# Patient Record
Sex: Female | Born: 1995 | Race: Black or African American | Hispanic: No | Smoking: Never smoker
Health system: Southern US, Community
[De-identification: ages and names within clinical notes are randomized; demographics above are authoritative.]

## PROBLEM LIST (undated history)

## (undated) DIAGNOSIS — R011 Cardiac murmur, unspecified: Secondary | ICD-10-CM

## (undated) DIAGNOSIS — G43909 Migraine, unspecified, not intractable, without status migrainosus: Secondary | ICD-10-CM

## (undated) DIAGNOSIS — J45909 Unspecified asthma, uncomplicated: Secondary | ICD-10-CM

## (undated) HISTORY — DX: Cardiac murmur, unspecified: R01.1

## (undated) HISTORY — DX: Migraine, unspecified, not intractable, without status migrainosus: G43.909

---
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
## 2014-06-14 IMAGING — CT CT ABD-PELV W/ CM
1 of 2 series · 15 of 32 positions shown, 19 images · IV contrast (OMNIPAQUE)
Comparison: None

CLINICAL DATA: Left side lump, abdominal pain, question speedy
ileum hernia

CT ABDOMEN AND PELVIS WITH CONTRAST
TECHNIQUE: Multidetector CT imaging of the abdomen and pelvis was
performed following the standard protocol during bolus
administration of intravenous contrast. Sagittal and coronal MPR
images reconstructed from axial data set.
Contrast: 80mL OMNIPAQUE IOHEXOL 300 MG/ML  SOLN Dilute oral
contrast.

[Series 2: routine abdomen/pelvis with · axial · 0.60mm/px · z∈[+654,+1019]mm · 15 of 81 slices shown, 19 images]
[im 4/81  soft-tissue]
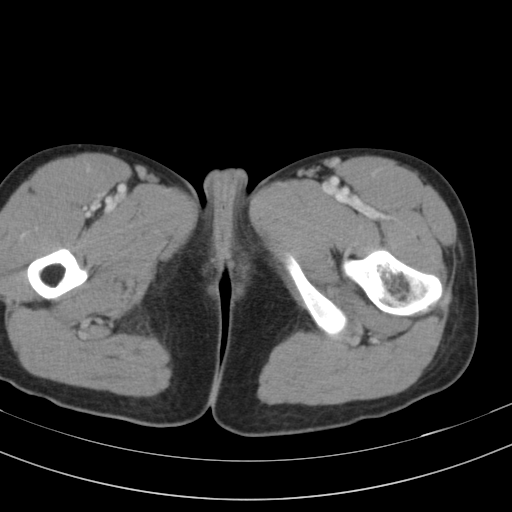
[im 4/81  bone]
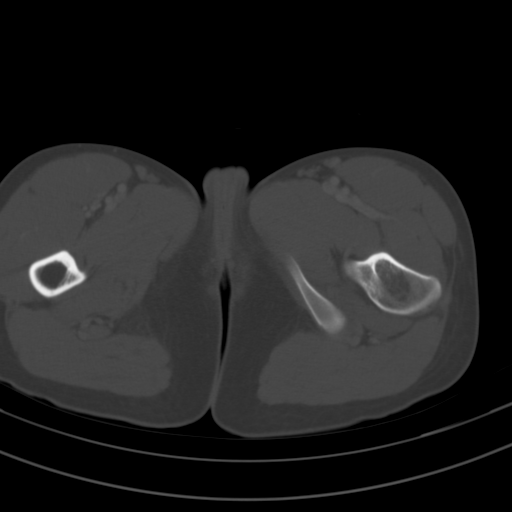
[im 10/81  soft-tissue]
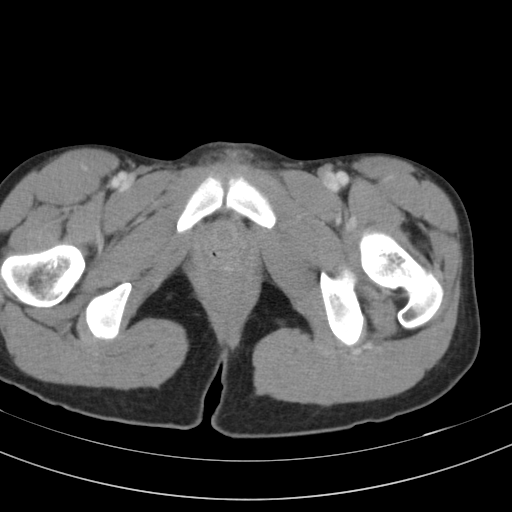
[im 17/81  soft-tissue]
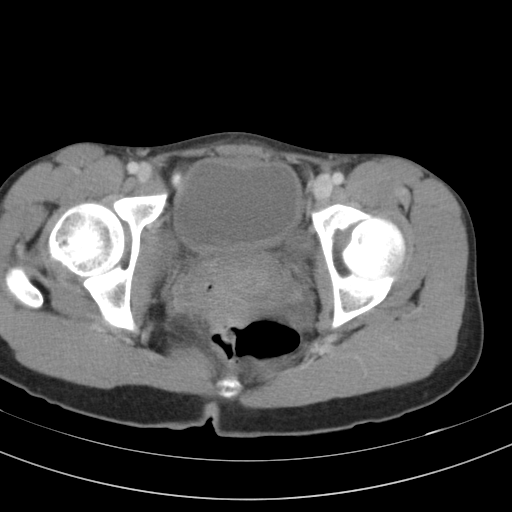
[im 23/81  soft-tissue]
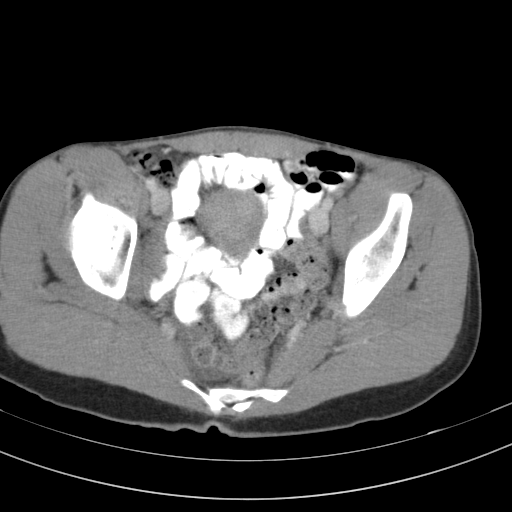
[im 29/81  soft-tissue]
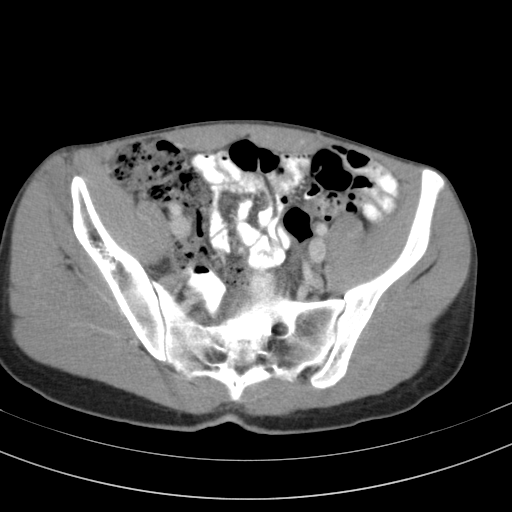
[im 36/81  soft-tissue]
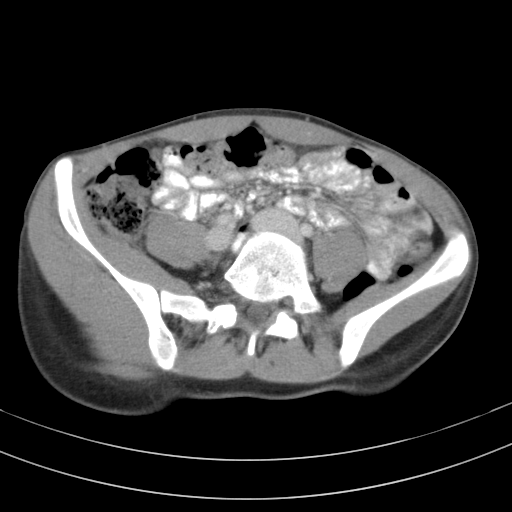
[im 42/81  soft-tissue]
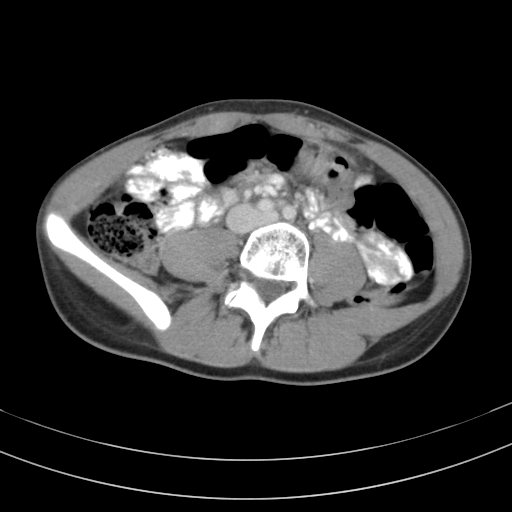
[im 45/81  soft-tissue]
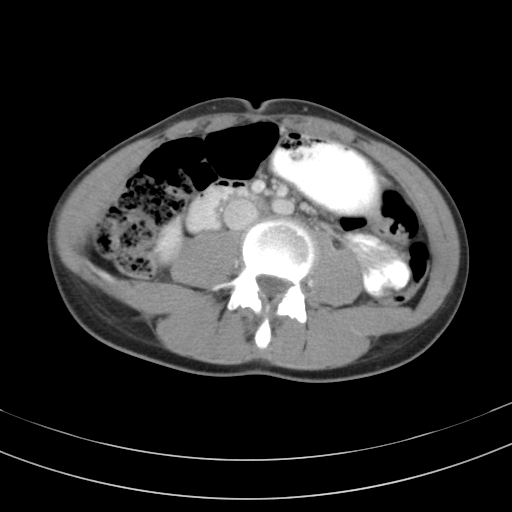
[im 52/81  soft-tissue]
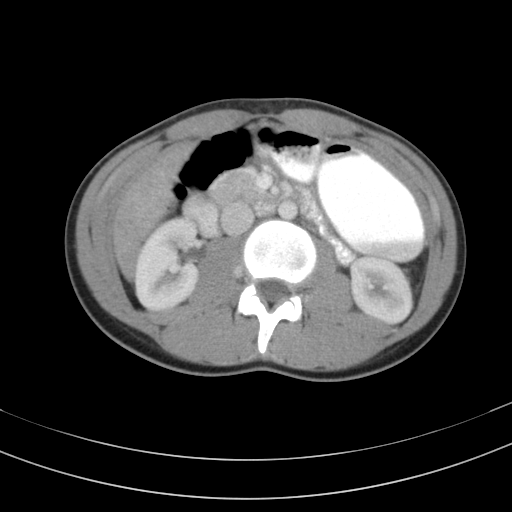
[im 52/81  bone]
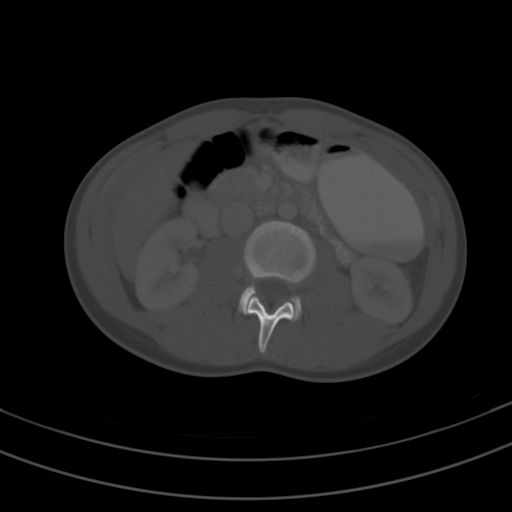
[im 58/81  soft-tissue]
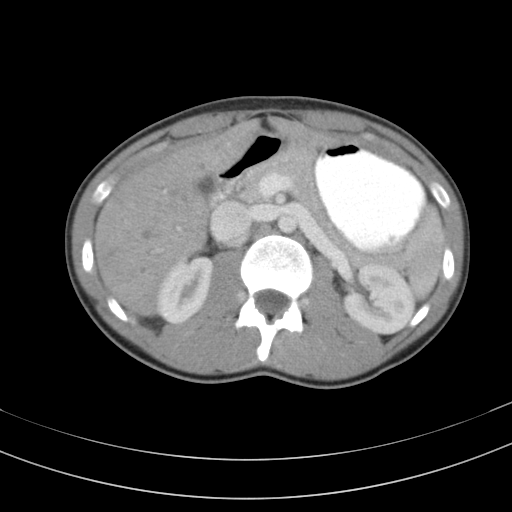
[im 65/81  soft-tissue]
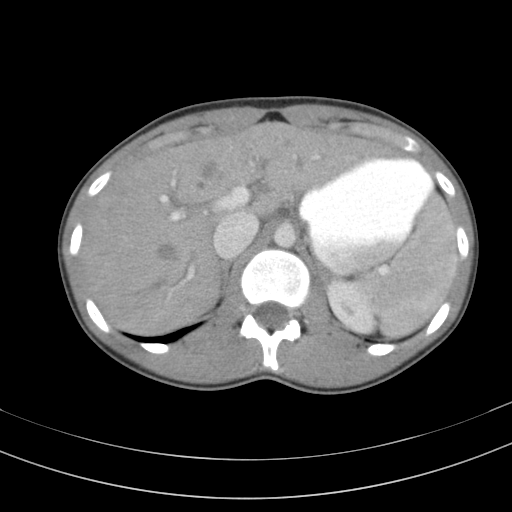
[im 68/81  lung]
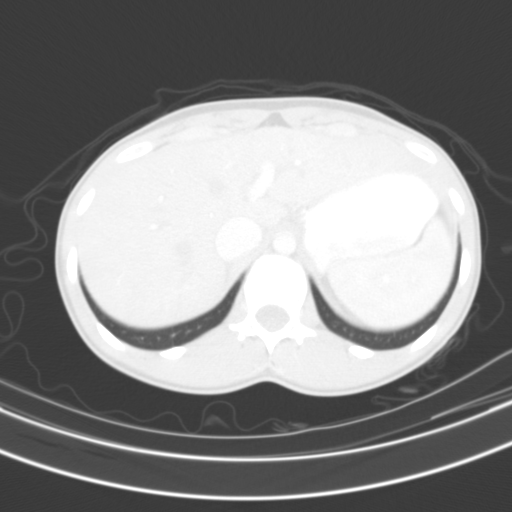
[im 71/81  soft-tissue]
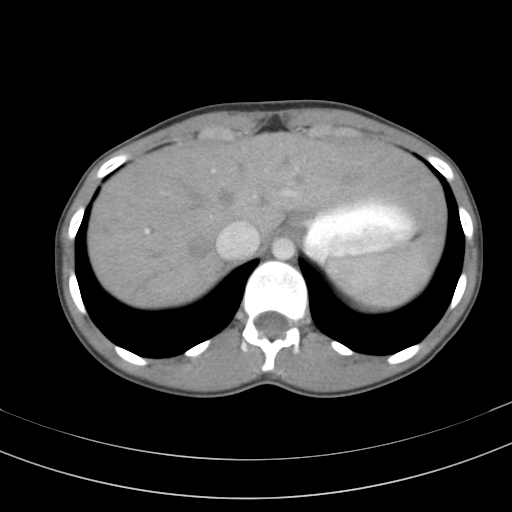
[im 71/81  lung]
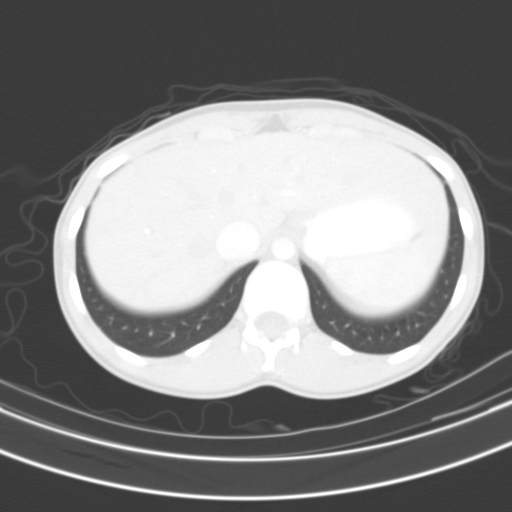
[im 74/81  lung]
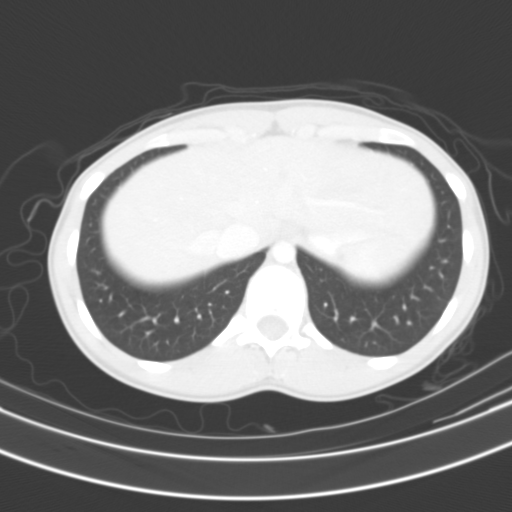
[im 77/81  soft-tissue]
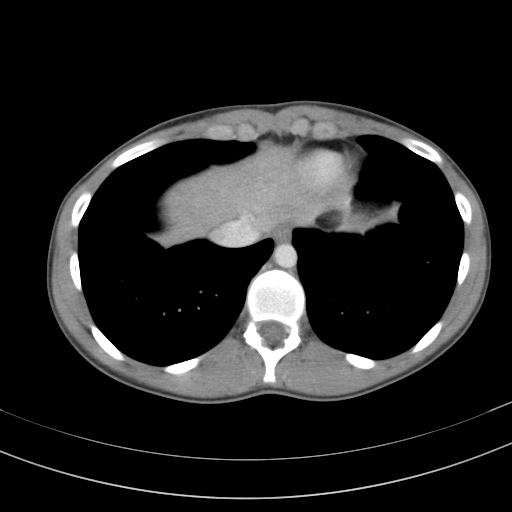
[im 77/81  lung]
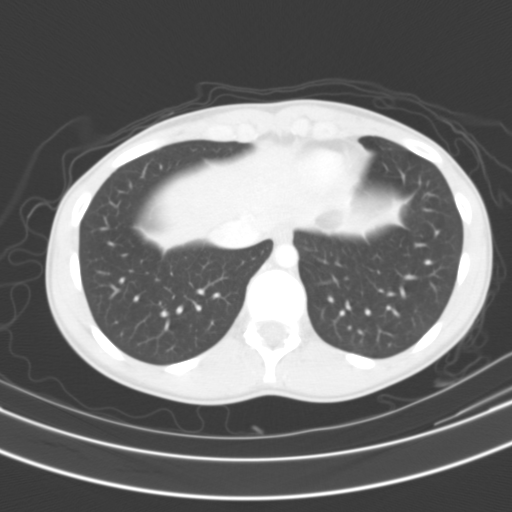

[15 of 32 positions shown; findings below may reference images not displayed]

FINDINGS: Lung bases clear.
Liver, spleen, pancreas, kidneys, and adrenal glands normal.
Tiny umbilical hernia containing fat is questioned.
No evidence of Spigelian hernia identified.
Bladder wall appears minimally prominent though bladder is
incompletely distended. Normal appearing uterus and adnexae for
age, with small right ovarian cysts noted.

Probable normal appendix in lateral right pelvis.
Small amount of nonspecific free pelvic fluid.
Stomach and bowel loops unremarkable.
No mass, adenopathy, additional hernia or free air.
No acute osseous findings.
IMPRESSION: No evidence of Spigelian hernia or left side abdominal mass.
Probable tiny umbilical hernia containing fat.

## 2019-01-09 DIAGNOSIS — M419 Scoliosis, unspecified: Secondary | ICD-10-CM | POA: Insufficient documentation

## 2019-07-27 DIAGNOSIS — E559 Vitamin D deficiency, unspecified: Secondary | ICD-10-CM | POA: Insufficient documentation

## 2021-01-18 ENCOUNTER — Ambulatory Visit (INDEPENDENT_AMBULATORY_CARE_PROVIDER_SITE_OTHER): Payer: 59 | Admitting: Family Medicine

## 2021-01-18 ENCOUNTER — Encounter: Payer: Self-pay | Admitting: Family Medicine

## 2021-01-18 ENCOUNTER — Other Ambulatory Visit: Payer: Self-pay

## 2021-01-18 VITALS — BP 108/68 | HR 79 | Temp 99.1°F | Ht 67.0 in | Wt 158.4 lb

## 2021-01-18 DIAGNOSIS — G43109 Migraine with aura, not intractable, without status migrainosus: Secondary | ICD-10-CM

## 2021-01-18 DIAGNOSIS — F411 Generalized anxiety disorder: Secondary | ICD-10-CM

## 2021-01-18 DIAGNOSIS — F321 Major depressive disorder, single episode, moderate: Secondary | ICD-10-CM | POA: Diagnosis not present

## 2021-01-18 MED ORDER — SUMATRIPTAN SUCCINATE 100 MG PO TABS: 100.0000 mg | ORAL_TABLET | ORAL | 2 refills | Status: DC | PRN

## 2021-01-18 NOTE — Patient Instructions (Addendum)
Please consider counseling. Contact [phone] to schedule an appointment or inquire about cost/insurance coverage.  Aim to do some physical exertion for 150 minutes per week. This is typically divided into 5 days per week, 30 minutes per day. The activity should be enough to get your heart rate up. Anything is better than nothing if you have time constraints.  Let me know if there are cost issues.   Coping skills Choose 5 that work for you: Take a deep breath Count to 20 Read a book Do a puzzle Meditate Bake Sing Knit Garden Pray Go outside Call a friend Listen to music Take a walk Color Send a note Take a bath Watch a movie Be alone in a quiet place Pet an animal Visit a friend Journal Exercise Stretch   Let us know if you need anything.

## 2021-01-18 NOTE — Progress Notes (Signed)
Chief Complaint  Patient presents with   New Patient (Initial Visit)    Discuss referral to talk to a therapist for Depression/anxiety.       New Patient Visit SUBJECTIVE: HPI: Jasmine Dillon is an 25 y.o.female who is being seen for establishing care.  The patient was previously seen at office across town prior to her moving.  Depression/anxiety Has had issues since early adolescence. Sister has similar issues. She is on medication. Pt was on a sleeping med as she did not want to rely on things. She does not exercise routinely. No HI or SI. No self medication. She is not currently following with a therapist and is interested in doing so.   Migraines Hx of migraines. Lack of eating is a trigger. Lots of screen time can trigger them. Has had since high school. Took a migraine cocktail form PCP in past.  She does not remember what was included but it did make her tired.  Gets around 10 days per month.  She takes Excedrin as needed.  She is not pregnant and is taking the Depo-Provera injection every 3 months for contraception.  She does get an aura with visual sensations and sometimes a fullness on the side of her head.  Past Medical History:  Diagnosis Date   Asthma    Heart murmur    Migraine    History reviewed. No pertinent surgical history. Family History  Problem Relation Age of Onset   Arthritis Mother    Drug abuse Father    Asthma Sister    Drug abuse Maternal Grandmother    COPD Maternal Grandmother    Arthritis Maternal Grandmother    Drug abuse Maternal Grandfather    Hypertension Paternal Grandmother    Drug abuse Paternal Grandmother    Alcohol abuse Paternal Grandmother    Hypertension Paternal Grandfather    Drug abuse Paternal Grandfather    Alcohol abuse Paternal Grandfather    No Known Allergies  Current Outpatient Medications:    albuterol (PROVENTIL HFA;VENTOLIN HFA) 108 (90 BASE) MCG/ACT inhaler, Inhale 2 puffs into the lungs every 6 (six) hours as  needed. For shortness of breath/wheezing, Disp: , Rfl:    ibuprofen (ADVIL,MOTRIN) 200 MG tablet, Take 400 mg by mouth every 6 (six) hours as needed for pain., Disp: , Rfl:    medroxyPROGESTERone (DEPO-PROVERA) 150 MG/ML injection, Inject 150 mg into the muscle every 3 (three) months., Disp: , Rfl:    Multiple Vitamin (MULTIVITAMIN WITH MINERALS) TABS tablet, Take 1 tablet by mouth daily., Disp: , Rfl:    SUMAtriptan (IMITREX) 100 MG tablet, Take 1 tablet (100 mg total) by mouth every 2 (two) hours as needed for migraine. May repeat in 2 hours if headache persists or recurs., Disp: 10 tablet, Rfl: 2  OBJECTIVE: BP 108/68   Pulse 79   Temp 99.1 F (37.3 C) (Oral)   Ht 5\' 7"  (1.702 m)   Wt 158 lb 6 oz (71.8 kg)   SpO2 99%   BMI 24.81 kg/m  General:  well developed, well nourished, in no apparent distress Skin:  no significant moles, warts, or growths Throat/Pharynx:  lips and gingiva without lesion; tongue and uvula midline; non-inflamed pharynx; no exudates or postnasal drainage Lungs:  clear to auscultation, breath sounds equal bilaterally, no respiratory distress Cardio:  regular rate and rhythm, no LE edema or bruits Musculoskeletal:  symmetrical muscle groups noted without atrophy or deformity Neuro:  gait normal, DTRs equal and symmetric throughout, no clonus, no cerebellar  signs, 5/5 strength throughout Psych: well oriented with normal range of affect and appropriate judgment/insight  ASSESSMENT/PLAN: Depression, major, single episode, moderate (HCC) - Plan: Ambulatory referral to Psychology  GAD (generalized anxiety disorder) - Plan: Ambulatory referral to Psychology  Migraine with aura and without status migrainosus, not intractable - Plan: SUMAtriptan (IMITREX) 100 MG tablet  1/2.  Refer to psychology.  We will also provide mental health information resources in her paperwork.  Counseled on exercise.  Declined medication. 3.  Chronic, unstable.  Add Imitrex as needed.  Will  be interesting to see how she does with improved anxiety/depression as this does trigger her migraines.  Declined prophylactic. Patient should return in 2 months to recheck her headaches. The patient voiced understanding and agreement to the plan.   Jilda Roche [place], DO 01/18/21  3:11 PM

## 2021-03-22 ENCOUNTER — Ambulatory Visit (INDEPENDENT_AMBULATORY_CARE_PROVIDER_SITE_OTHER): Payer: 59 | Admitting: Psychology

## 2021-03-22 DIAGNOSIS — F4310 Post-traumatic stress disorder, unspecified: Secondary | ICD-10-CM | POA: Diagnosis not present

## 2021-04-07 ENCOUNTER — Ambulatory Visit (INDEPENDENT_AMBULATORY_CARE_PROVIDER_SITE_OTHER): Payer: 59 | Admitting: Psychology

## 2021-04-07 DIAGNOSIS — F4310 Post-traumatic stress disorder, unspecified: Secondary | ICD-10-CM

## 2021-04-19 ENCOUNTER — Ambulatory Visit: Payer: 59 | Admitting: Psychology

## 2021-07-05 ENCOUNTER — Ambulatory Visit: Payer: 59 | Admitting: Family Medicine

## 2021-07-05 ENCOUNTER — Encounter: Payer: Self-pay | Admitting: Family Medicine

## 2021-07-05 VITALS — BP 110/70 | HR 83 | Temp 98.5°F | Ht 67.0 in | Wt 158.1 lb

## 2021-07-05 DIAGNOSIS — G43109 Migraine with aura, not intractable, without status migrainosus: Secondary | ICD-10-CM

## 2021-07-05 DIAGNOSIS — R040 Epistaxis: Secondary | ICD-10-CM | POA: Diagnosis not present

## 2021-07-05 DIAGNOSIS — J302 Other seasonal allergic rhinitis: Secondary | ICD-10-CM

## 2021-07-05 MED ORDER — PROPRANOLOL HCL ER 60 MG PO CP24: 60.0000 mg | ORAL_CAPSULE | Freq: Every day | ORAL | 2 refills | Status: DC

## 2021-07-05 MED ORDER — ALBUTEROL SULFATE HFA 108 (90 BASE) MCG/ACT IN AERS: 2.0000 | INHALATION_SPRAY | Freq: Four times a day (QID) | RESPIRATORY_TRACT | 2 refills | Status: DC | PRN

## 2021-07-05 MED ORDER — CETIRIZINE HCL 10 MG PO TABS: 10.0000 mg | ORAL_TABLET | Freq: Every day | ORAL | 11 refills | Status: DC

## 2021-07-05 MED ORDER — RIZATRIPTAN BENZOATE 10 MG PO TABS: 10.0000 mg | ORAL_TABLET | ORAL | 0 refills | Status: DC | PRN

## 2021-07-05 NOTE — Progress Notes (Signed)
Chief Complaint  Patient presents with   Follow-up    Refills on all allergy medications/inhalers Nose bleeds Migraines, order a MRI   Epistaxis    Subjective: Patient is a 26 y.o. female here for f/u.  Seasonal allergies- Was on INCS and Xyzal and still having runny nose and itchy eyes.  Wondering if there is anything else.  Nose bleeds- 3 weeks ago had some nose bleeds. Started on R side and then hit both sides. No trauma. Does have allergies and blows her nose a lot.  She has not had any issues over the past week.  Migraines - hx of cluster migraines. She will slur her words and have balance issues. Pain is severe. Tries taking ibuprofen and Excedrin with little relief. She has failed Imitrex that did sedate her but did not help very much. Stress, poor PO intake and poor hydration contribute to triggering these. Associated balance issues, nausea and vision changes.   Past Medical History:  Diagnosis Date   Asthma    Heart murmur    Migraine     Objective: BP 110/70    Pulse 83    Temp 98.5 F (36.9 C) (Oral)    Ht 5\' 7"  (1.702 m)    Wt 158 lb 2 oz (71.7 kg)    SpO2 99%    BMI 24.77 kg/m  General: Awake, appears stated age Heart: RRR, no LE edema HEENT: Nares are patent without discharge, over the medial right nare/septum there is evidence of bleeding and varicosities noted. Lungs: CTAB, no rales, wheezes or rhonchi. No accessory muscle use Neuro: DTR's equal and symmetric throughout, no clonus, no cerebellar signs, 5/5 strength throughout.  MSK: No TTP over the suboccipital triangle bilaterally, trapezius, or paraspinal cervical musculature Psych: Age appropriate judgment and insight, normal affect and mood  Assessment and Plan: Seasonal allergies  Migraine with aura and without status migrainosus, not intractable - Plan: rizatriptan (MAXALT) 10 MG tablet, propranolol ER (INDERAL LA) 60 MG 24 hr capsule  Epistaxis  Chronic, uncontrolled.  Change Xyzal to Zyrtec.   Continue intranasal corticosteroid.  Would consider adding Singulair 10 mg daily. Chronic, uncontrolled.  Change Imitrex to Maxalt.  Add daily prophylactic as above.  Follow-up in 1 month. Air humidifier, avoid digital trauma, antibiotic ointment twice daily.  Consider Vaseline in between.  Her main nostril on exam is the right 1 that needs to be addressed.  I do not think she needs cauterization at this time. The patient voiced understanding and agreement to the plan.  [place], DO 07/05/21  1:41 PM

## 2021-07-05 NOTE — Patient Instructions (Addendum)
Keep the neck loose and stay hydrated.   Consider an air humidifier.   Let's hold off on a scan for now.   There should be no issues with cost.   Use neosporin up your R nostril twice daily.   Let us know if you need anything.  Please consider counseling. Contact [phone] to schedule an appointment or inquire about cost/insurance coverage.  [facility] located at [address], [address], [place], [place].  Phone number = [phone].  Dr. Regan Lemming - Adult Psychiatry.    [facility] located at [address] [place], [place], [place]. Phone number = [phone].   [facility] located at [address] [place], [place].  Phone number = [phone].   [facility] located at [address] [place], [place], [place].  Phone number = [phone].

## 2021-08-09 ENCOUNTER — Ambulatory Visit: Payer: 59 | Admitting: Family Medicine

## 2021-08-16 ENCOUNTER — Ambulatory Visit: Payer: 59 | Admitting: Family Medicine

## 2021-09-27 ENCOUNTER — Ambulatory Visit: Payer: 59 | Admitting: Family Medicine

## 2022-01-24 ENCOUNTER — Ambulatory Visit: Payer: Self-pay | Admitting: Family Medicine

## 2022-01-31 ENCOUNTER — Encounter: Payer: Self-pay | Admitting: Family Medicine

## 2022-01-31 ENCOUNTER — Ambulatory Visit (INDEPENDENT_AMBULATORY_CARE_PROVIDER_SITE_OTHER): Payer: Commercial Managed Care - HMO | Admitting: Family Medicine

## 2022-01-31 VITALS — BP 108/68 | HR 73 | Temp 98.6°F | Ht 67.0 in | Wt 163.0 lb

## 2022-01-31 DIAGNOSIS — J302 Other seasonal allergic rhinitis: Secondary | ICD-10-CM | POA: Diagnosis not present

## 2022-01-31 MED ORDER — MONTELUKAST SODIUM 10 MG PO TABS: 10.0000 mg | ORAL_TABLET | Freq: Every day | ORAL | 3 refills | Status: DC

## 2022-01-31 MED ORDER — LEVOCETIRIZINE DIHYDROCHLORIDE 5 MG PO TABS: 5.0000 mg | ORAL_TABLET | Freq: Every evening | ORAL | 2 refills | Status: DC

## 2022-01-31 NOTE — Progress Notes (Signed)
Chief Complaint  Patient presents with   Allergies    Medication for allergies is not working.    Subjective: Patient is a 26 y.o. female here for allergies.  Has been using Zyrtec 10 mg/d and Flonase without significant relief. She will get sneezing, runny/stuffy nose, fatigue, itchy/watery eyes, itchy throat, ear fullness/pain.  She denies any fevers, shortness of breath, or coughing.  She has never been on Xyzal/levocetirizine or montelukast.  Past Medical History:  Diagnosis Date   Asthma    Heart murmur    Migraine     Objective: BP 108/68   Pulse 73   Temp 98.6 F (37 C) (Oral)   Ht 5\' 7"  (1.702 m)   Wt 163 lb (73.9 kg)   SpO2 98%   BMI 25.53 kg/m  General: Awake, appears stated age Heart: RRR HEENT: Ears patent, TM's neg, nares patent without rhinorrhea, tonsillar pillars with hyperemia. No exudate.  Lungs: CTAB, no rales, wheezes or rhonchi. No accessory muscle use Psych: Age appropriate judgment and insight, normal affect and mood  Assessment and Plan: Seasonal allergies - Plan: montelukast (SINGULAIR) 10 MG tablet, levocetirizine (XYZAL) 5 MG tablet  Chronic, uncontrolled.  Continue Flonase but start daily.  Change cetirizine to levocetirizine 5 mg daily.  Add montelukast 10 mg daily as needed for second line therapy.  I would like to see her in 1 month to recheck this and do her physical. The patient voiced understanding and agreement to the plan.  [place], DO 01/31/22  4:29 PM

## 2022-01-31 NOTE — Patient Instructions (Signed)
Continue the Flonase.  We are changing the cetirizine to levocetirizine. Sometimes this can help. We are also adding a new medication as an adjunct to these.   Let us know if you need anything.

## 2022-03-23 ENCOUNTER — Ambulatory Visit
Admission: EM | Admit: 2022-03-23 | Discharge: 2022-03-23 | Disposition: A | Discharge status: Home / Self Care | Payer: Commercial Managed Care - HMO | Attending: Urgent Care | Admitting: Urgent Care

## 2022-03-23 DIAGNOSIS — T781XXA Other adverse food reactions, not elsewhere classified, initial encounter: Secondary | ICD-10-CM

## 2022-03-23 MED ORDER — EPINEPHRINE 0.3 MG/0.3ML IJ SOAJ: 0.3000 mg | INTRAMUSCULAR | 0 refills | Status: DC | PRN

## 2022-03-23 NOTE — ED Triage Notes (Signed)
Patient presents to UC for possible allergic reaction to handling different type of vegetables. After she began feeling a burning sensation to her face.

## 2022-03-23 NOTE — Discharge Instructions (Signed)
You appear to have had a hypersensitivity reaction, not a true allergic reaction. Due to improvement of your symptoms, watching and waiting is appropriate. You may consider taking Benadryl at home 25 to 50 mg. I did prescribe you an EpiPen in case of anaphylactic reactions in the future. Please have your primary care physician refer you to an immunologist or allergist for further evaluation.

## 2022-03-23 NOTE — ED Provider Notes (Signed)
[facility]    CSN: [id number] Arrival date & time: 03/23/22  1402      History   Chief Complaint Chief Complaint  Patient presents with   Allergic Reaction    HPI Jasmine Dillon is a 26 y.o. female.   Pleasant 26 year old female with a past medical history of allergies and asthma presents today due to concerns of a tingling pulling sensation to her face.  Patient states she uses the home chef box, and got a new [place] recipe today.  She states she was using a long green pepper (uncertain what type of chili it was) and was cutting it up.  After cutting the pepper, she states she was feeling tight in her face.  Reports that her nose was burning her lips were burning and her face was burning.  She has been using a cool cloth to wash off her face and feels that this has been helping.  She was concerned as she felt she went into a panic attack and thought she was having an allergic reaction.  Patient denies any swelling in her lips or throat.  She denies dysphagia, cough or wheezing.  She admits that since symptom onset over an hour ago, symptoms are almost resolved.  She does have a history of allergic reactions in the past and does not have an EpiPen at home.  She continues to take levocetirizine and montelukast daily.   Allergic Reaction   Past Medical History:  Diagnosis Date   Asthma    Heart murmur    Migraine     Patient Active Problem List   Diagnosis Date Noted   Migraine with aura and without status migrainosus, not intractable 07/05/2021   Seasonal allergies 07/05/2021   Vitamin D deficiency 07/27/2019   Scoliosis deformity of spine 01/09/2019   Asthma 12/16/1997   Heart murmur Dec 03, 1994    History reviewed. No pertinent surgical history.  OB History   No obstetric history on file.      Home Medications    Prior to Admission medications   Medication Sig Start Date End Date Taking? Authorizing Provider  EPINEPHrine 0.3 mg/0.3 mL IJ SOAJ  injection Inject 0.3 mg into the muscle as needed for anaphylaxis. 03/23/22  Yes Virgilia Quigg L, PA  albuterol (VENTOLIN HFA) 108 (90 Base) MCG/ACT inhaler Inhale 2 puffs into the lungs every 6 (six) hours as needed. For shortness of breath/wheezing 07/05/21   Shelda Pal, DO  ibuprofen (ADVIL,MOTRIN) 200 MG tablet Take 400 mg by mouth every 6 (six) hours as needed for pain.    [provider]  levocetirizine (XYZAL) 5 MG tablet Take 1 tablet (5 mg total) by mouth every evening. 01/31/22   Shelda Pal, DO  medroxyPROGESTERone (DEPO-PROVERA) 150 MG/ML injection Inject 150 mg into the muscle every 3 (three) months. 08/04/20   [provider]  montelukast (SINGULAIR) 10 MG tablet Take 1 tablet (10 mg total) by mouth at bedtime. 01/31/22   Shelda Pal, DO  Multiple Vitamin (MULTIVITAMIN WITH MINERALS) TABS tablet Take 1 tablet by mouth daily.    [provider]  propranolol ER (INDERAL LA) 60 MG 24 hr capsule Take 1 capsule (60 mg total) by mouth daily. 07/05/21   Shelda Pal, DO  rizatriptan (MAXALT) 10 MG tablet Take 1 tablet (10 mg total) by mouth as needed for migraine. May repeat in 2 hours if needed 07/05/21   Shelda Pal, DO    Family History Family History  Problem Relation Age of Onset   Arthritis Mother    Drug abuse Father    Asthma Sister    Drug abuse Maternal Grandmother    COPD Maternal Grandmother    Arthritis Maternal Grandmother    Drug abuse Maternal Grandfather    Hypertension Paternal Grandmother    Drug abuse Paternal Grandmother    Alcohol abuse Paternal Grandmother    Hypertension Paternal Grandfather    Drug abuse Paternal Grandfather    Alcohol abuse Paternal Grandfather     Social History Social History   Tobacco Use   Smoking status: Never  Substance Use Topics   Alcohol use: No   Drug use: No     Allergies   Patient has no known allergies.   Review of Systems Review of  Systems As per HPI  Physical Exam Triage Vital Signs ED Triage Vitals  Enc Vitals Group     BP 03/23/22 1428 132/85     Pulse Rate 03/23/22 1428 86     Resp 03/23/22 1428 16     Temp 03/23/22 1428 99.1 F (37.3 C)     Temp Source 03/23/22 1428 Oral     SpO2 03/23/22 1428 99 %     Weight --      Height --      Head Circumference --      Peak Flow --      Pain Score 03/23/22 1456 0     Pain Loc --      Pain Edu? --      Excl. in GC? --    No data found.  Updated Vital Signs BP 132/85 (BP Location: Left Arm)   Pulse 86   Temp 99.1 F (37.3 C) (Oral)   Resp 16   SpO2 99%   Visual Acuity Right Eye Distance:   Left Eye Distance:   Bilateral Distance:    Right Eye Near:   Left Eye Near:    Bilateral Near:     Physical Exam Vitals and nursing note reviewed. Exam conducted with a chaperone present.  Constitutional:      General: She is not in acute distress.    Appearance: Normal appearance. She is well-developed and normal weight. She is not ill-appearing, toxic-appearing or diaphoretic.  HENT:     Head: Normocephalic and atraumatic.     Right Ear: Tympanic membrane, ear canal and external ear normal. There is no impacted cerumen.     Left Ear: Tympanic membrane, ear canal and external ear normal. There is no impacted cerumen.     Nose: Nose normal. No congestion or rhinorrhea.     Mouth/Throat:     Mouth: Mucous membranes are moist.     Pharynx: Oropharynx is clear. No oropharyngeal exudate or posterior oropharyngeal erythema.  Eyes:     General: No scleral icterus.       Right eye: No discharge.        Left eye: No discharge.     Extraocular Movements: Extraocular movements intact.     Conjunctiva/sclera: Conjunctivae normal.     Pupils: Pupils are equal, round, and reactive to light.  Cardiovascular:     Rate and Rhythm: Normal rate and regular rhythm.     Heart sounds: No murmur heard. Pulmonary:     Effort: Pulmonary effort is normal. No respiratory  distress.     Breath sounds: Normal breath sounds. No stridor. No wheezing, rhonchi or rales.  Chest:     Chest wall: No tenderness.  Abdominal:     Palpations: Abdomen is soft.     Tenderness: There is no abdominal tenderness.  Musculoskeletal:        General: No swelling.     Cervical back: Normal range of motion and neck supple. No rigidity or tenderness.  Lymphadenopathy:     Cervical: No cervical adenopathy.  Skin:    General: Skin is warm and dry.     Capillary Refill: Capillary refill takes less than 2 seconds.     Coloration: Skin is not jaundiced.     Findings: No bruising, erythema or rash.  Neurological:     Mental Status: She is alert.  Psychiatric:        Mood and Affect: Mood normal.      UC Treatments / Results  Labs (all labs ordered are listed, but only abnormal results are displayed) Labs Reviewed - No data to display  EKG   Radiology No results found.  Procedures Procedures (including critical care time)  Medications Ordered in UC Medications - No data to display  Initial Impression / Assessment and Plan / UC Course  I have reviewed the triage vital signs and the nursing notes.  Pertinent labs & imaging results that were available during my care of the patient were reviewed by me and considered in my medical decision making (see chart for details).     Hypersensitivity food reaction -patient's symptoms were nearly resolved at time of evaluation.  I suspect she had a hypersensitivity reaction to the pepper, which resolved with washing her face with a cool cloth.  No additional signs or symptoms on exam of allergic reaction.  EpiPen called in given patient's history of allergies and asthma in the past, discussed appropriate use.  Recommended to follow-up or referral to an allergist for further evaluation.   Final Clinical Impressions(s) / UC Diagnoses   Final diagnoses:  Hypersensitivity reaction due to food     Discharge Instructions       You appear to have had a hypersensitivity reaction, not a true allergic reaction. Due to improvement of your symptoms, watching and waiting is appropriate. You may consider taking Benadryl at home 25 to 50 mg. I did prescribe you an EpiPen in case of anaphylactic reactions in the future. Please have your primary care physician refer you to an immunologist or allergist for further evaluation.    ED Prescriptions     Medication Sig Dispense Auth. Provider   EPINEPHrine 0.3 mg/0.3 mL IJ SOAJ injection Inject 0.3 mg into the muscle as needed for anaphylaxis. 1 each Maretta Bees, PA      PDMP not reviewed this encounter.   Maretta Bees, [place] 03/23/22 [phone]

## 2022-05-25 ENCOUNTER — Other Ambulatory Visit: Payer: Self-pay | Admitting: Family Medicine

## 2022-05-25 DIAGNOSIS — J302 Other seasonal allergic rhinitis: Secondary | ICD-10-CM

## 2022-10-27 ENCOUNTER — Other Ambulatory Visit: Payer: Self-pay | Admitting: Family Medicine

## 2022-10-27 DIAGNOSIS — J302 Other seasonal allergic rhinitis: Secondary | ICD-10-CM

## 2023-03-26 ENCOUNTER — Ambulatory Visit: Payer: 59 | Admitting: Family Medicine

## 2023-06-02 ENCOUNTER — Other Ambulatory Visit: Payer: Self-pay | Admitting: Family Medicine

## 2023-06-02 DIAGNOSIS — J302 Other seasonal allergic rhinitis: Secondary | ICD-10-CM

## 2023-08-19 ENCOUNTER — Ambulatory Visit
Admission: EM | Admit: 2023-08-19 | Discharge: 2023-08-19 | Disposition: A | Discharge status: Home / Self Care | Payer: No Typology Code available for payment source | Attending: Family Medicine | Admitting: Family Medicine

## 2023-08-19 DIAGNOSIS — Z3201 Encounter for pregnancy test, result positive: Secondary | ICD-10-CM

## 2023-08-19 DIAGNOSIS — Z3A01 Less than 8 weeks gestation of pregnancy: Secondary | ICD-10-CM

## 2023-08-19 LAB — POCT URINE PREGNANCY: Preg Test, Ur: POSITIVE — AB

## 2023-08-19 NOTE — ED Triage Notes (Signed)
 Pt requested pregnancy test. Reports nausea. Reports 2 positive pregnancy test yesterday.

## 2023-08-19 NOTE — Discharge Instructions (Signed)
 Please establish with OB for your prenatal care.

## 2023-08-19 NOTE — ED Provider Notes (Signed)
 [facility]    CSN: [id number] Arrival date & time: 08/19/23  1249      History   Chief Complaint Chief Complaint  Patient presents with   Possible Pregnancy    HPI Jasmine Dillon is a 28 y.o. female presents for possible pregnancy.  Patient reports last day of her menstrual cycle was 06/27/2023.  Reports regular periods and is not on any type of birth control.  States she has had 2 positive home pregnancy test.  Endorses some nausea denies any abdominal pain or vaginal bleeding.  No other concerns at this time.   Possible Pregnancy    Past Medical History:  Diagnosis Date   Asthma    Heart murmur    Migraine     Patient Active Problem List   Diagnosis Date Noted   Migraine with aura and without status migrainosus, not intractable 07/05/2021   Seasonal allergies 07/05/2021   Vitamin D deficiency 07/27/2019   Scoliosis deformity of spine 01/09/2019   Asthma 12/16/1997   Heart murmur 1995-05-06    History reviewed. No pertinent surgical history.  OB History   No obstetric history on file.      Home Medications    Prior to Admission medications   Medication Sig Start Date End Date Taking? Authorizing Provider  aspirin-acetaminophen-caffeine (EXCEDRIN MIGRAINE) [id number] MG tablet Take by mouth every 6 (six) hours as needed for headache.   Yes [provider]  albuterol (VENTOLIN HFA) 108 (90 Base) MCG/ACT inhaler Inhale 2 puffs into the lungs every 6 (six) hours as needed. For shortness of breath/wheezing 07/05/21   Sharlene Dory, DO  EPINEPHrine 0.3 mg/0.3 mL IJ SOAJ injection Inject 0.3 mg into the muscle as needed for anaphylaxis. 03/23/22   Crain, Whitney L, PA  ibuprofen (ADVIL,MOTRIN) 200 MG tablet Take 400 mg by mouth every 6 (six) hours as needed for pain.    [provider]  levocetirizine (XYZAL) 5 MG tablet TAKE 1 TABLET BY MOUTH EVERY DAY IN THE EVENING 05/25/22   Wendling, Jilda Roche, DO  medroxyPROGESTERone  (DEPO-PROVERA) 150 MG/ML injection Inject 150 mg into the muscle every 3 (three) months. 08/04/20   [provider]  montelukast (SINGULAIR) 10 MG tablet TAKE 1 TABLET BY MOUTH EVERYDAY AT BEDTIME 06/04/23   Wendling, Jilda Roche, DO  Multiple Vitamin (MULTIVITAMIN WITH MINERALS) TABS tablet Take 1 tablet by mouth daily.    [provider]  propranolol ER (INDERAL LA) 60 MG 24 hr capsule Take 1 capsule (60 mg total) by mouth daily. 07/05/21   Sharlene Dory, DO  rizatriptan (MAXALT) 10 MG tablet Take 1 tablet (10 mg total) by mouth as needed for migraine. May repeat in 2 hours if needed 07/05/21   Wendling, Jilda Roche, DO    Family History Family History  Problem Relation Age of Onset   Arthritis Mother    Drug abuse Father    Asthma Sister    Drug abuse Maternal Grandmother    COPD Maternal Grandmother    Arthritis Maternal Grandmother    Drug abuse Maternal Grandfather    Hypertension Paternal Grandmother    Drug abuse Paternal Grandmother    Alcohol abuse Paternal Grandmother    Hypertension Paternal Grandfather    Drug abuse Paternal Grandfather    Alcohol abuse Paternal Grandfather     Social History Social History   Tobacco Use   Smoking status: Never  Vaping Use   Vaping status: Never Used  Substance Use Topics  Alcohol use: No   Drug use: No     Allergies   Patient has no known allergies.   Review of Systems Review of Systems  Genitourinary:  Positive for menstrual problem.     Physical Exam Triage Vital Signs ED Triage Vitals  Encounter Vitals Group     BP 08/19/23 1305 113/66     Systolic BP Percentile --      Diastolic BP Percentile --      Pulse Rate 08/19/23 1305 84     Resp 08/19/23 1305 16     Temp 08/19/23 1305 99.1 F (37.3 C)     Temp Source 08/19/23 1305 Oral     SpO2 08/19/23 1305 100 %     Weight --      Height --      Head Circumference --      Peak Flow --      Pain Score 08/19/23 1310 0     Pain Loc  --      Pain Education --      Exclude from Growth Chart --    No data found.  Updated Vital Signs BP 113/66 (BP Location: Right Arm)   Pulse 84   Temp 99.1 F (37.3 C) (Oral)   Resp 16   LMP 06/27/2023 (Exact Date)   SpO2 100%   Visual Acuity Right Eye Distance:   Left Eye Distance:   Bilateral Distance:    Right Eye Near:   Left Eye Near:    Bilateral Near:     Physical Exam Vitals and nursing note reviewed.  Constitutional:      Appearance: Normal appearance.  HENT:     Head: Normocephalic and atraumatic.  Eyes:     Pupils: Pupils are equal, round, and reactive to light.  Cardiovascular:     Rate and Rhythm: Normal rate.  Pulmonary:     Effort: Pulmonary effort is normal.  Skin:    General: Skin is warm and dry.  Neurological:     General: No focal deficit present.     Mental Status: She is alert and oriented to person, place, and time.  Psychiatric:        Mood and Affect: Mood normal.        Behavior: Behavior normal.      UC Treatments / Results  Labs (all labs ordered are listed, but only abnormal results are displayed) Labs Reviewed  POCT URINE PREGNANCY - Abnormal; Notable for the following components:      Result Value   Preg Test, Ur Positive (*)    All other components within normal limits    EKG   Radiology No results found.  Procedures Procedures (including critical care time)  Medications Ordered in UC Medications - No data to display  Initial Impression / Assessment and Plan / UC Course  I have reviewed the triage vital signs and the nursing notes.  Pertinent labs & imaging results that were available during my care of the patient were reviewed by me and considered in my medical decision making (see chart for details).     Reviewed exam and symptoms with patient.  Positive urine pregnancy in clinic.  Advised patient to follow-up with OB for prenatal care.  ER precautions reviewed and patient verbalized understanding. Final  Clinical Impressions(s) / UC Diagnoses   Final diagnoses:  Less than [redacted] weeks gestation of pregnancy     Discharge Instructions      Please establish with OB for your  prenatal care    ED Prescriptions   None    PDMP not reviewed this encounter.   Radford Pax, NP 08/19/23 1344

## 2023-09-28 ENCOUNTER — Other Ambulatory Visit (HOSPITAL_COMMUNITY)
Admission: RE | Admit: 2023-09-28 | Discharge: 2023-09-28 | Disposition: A | Discharge status: Home / Self Care | Source: Ambulatory Visit | Attending: Obstetrics and Gynecology | Admitting: Obstetrics and Gynecology

## 2023-09-28 ENCOUNTER — Encounter: Payer: Self-pay | Admitting: Obstetrics and Gynecology

## 2023-09-28 ENCOUNTER — Ambulatory Visit: Payer: 59 | Admitting: Obstetrics and Gynecology

## 2023-09-28 VITALS — BP 118/85 | HR 77 | Ht 67.0 in | Wt 130.0 lb

## 2023-09-28 DIAGNOSIS — Z124 Encounter for screening for malignant neoplasm of cervix: Secondary | ICD-10-CM | POA: Insufficient documentation

## 2023-09-28 DIAGNOSIS — Z01419 Encounter for gynecological examination (general) (routine) without abnormal findings: Secondary | ICD-10-CM | POA: Diagnosis present

## 2023-09-28 DIAGNOSIS — Z3201 Encounter for pregnancy test, result positive: Secondary | ICD-10-CM

## 2023-09-28 DIAGNOSIS — N898 Other specified noninflammatory disorders of vagina: Secondary | ICD-10-CM | POA: Diagnosis present

## 2023-09-28 DIAGNOSIS — Z1151 Encounter for screening for human papillomavirus (HPV): Secondary | ICD-10-CM | POA: Diagnosis not present

## 2023-09-28 DIAGNOSIS — F419 Anxiety disorder, unspecified: Secondary | ICD-10-CM | POA: Diagnosis not present

## 2023-09-28 LAB — POCT URINE PREGNANCY: Preg Test, Ur: POSITIVE — AB

## 2023-09-28 NOTE — Progress Notes (Signed)
 ANNUAL EXAM Patient name: Jasmine Dillon MRN [id number]  Date of birth: 1996-03-07 Chief Complaint:   Gynecologic Exam  History of Present Illness:   Jasmine Dillon is a 28 y.o. No obstetric history on file. being seen today for a routine annual exam.  Current complaints: annual, resuem depo  Discussed the use of AI scribe software for clinical note transcription with the patient, who gave verbal consent to proceed.  History of Present Illness Jasmine Dillon is a 28 year old female who presents with spotting post-abortion and for an annual exam.  She underwent a surgical abortion on September 04, 2023, and has since experienced spotting and irregular bleeding. Initially, there was heavy bleeding a few days post-procedure, which transitioned to spotting. The discharge is sometimes pink and heavier on certain days, described as 'wetter than normal'. No breast or nipple changes and no itching associated with the discharge. She has not required a blood transfusion following the abortion.  Since the abortion, she has had unprotected intercourse once, approximately a week ago. A pregnancy test conducted today was positive, raising concerns about whether this is due to residual hormone levels from the previous pregnancy or a new pregnancy.  She has a history of using Depo-Provera for six to seven years, which she discontinued due to weight gain, concerns about bone density, and depression. She wants to restart Depo-Provera.  Patient's last menstrual period was 06/27/2023 (exact date).   The pregnancy intention screening data noted above was reviewed. Potential methods of contraception were discussed. The patient elected to proceed with No data recorded.   Last pap No results found for: "DIAGPAP", "HPVHIGH", "ADEQPAP" Last mammogram: n/a.  Last colonoscopy: n/a.      01/31/2022    3:06 PM 07/05/2021   12:52 PM 01/18/2021   12:51 PM  Depression screen PHQ 2/9  Decreased Interest 0 0 1  Down,  Depressed, Hopeless 0 0 1  PHQ - 2 Score 0 0 2  Altered sleeping 1 0 3  Tired, decreased energy 2 0 1  Change in appetite 1 0 1  Feeling bad or failure about yourself  0 0 2  Trouble concentrating 0 0 2  Moving slowly or fidgety/restless 0 0 1  Suicidal thoughts 0 0 0  PHQ-9 Score 4 0 12  Difficult doing work/chores Not difficult at all Not difficult at all Somewhat difficult         No data to display           Review of Systems:   Pertinent items are noted in HPI Denies any headaches, blurred vision, fatigue, shortness of breath, chest pain, abdominal pain, abnormal vaginal discharge/itching/odor/irritation, problems with periods, bowel movements, urination, or intercourse unless otherwise stated above. Pertinent History Reviewed:  Reviewed past medical,surgical, social and family history.  Reviewed problem list, medications and allergies. Physical Assessment:  There were no vitals filed for this visit.There is no height or weight on file to calculate BMI.        Physical Examination:   General appearance - well appearing, and in no distress  Mental status - alert, oriented to person, place, and time  Psych:  She has a normal mood and affect  Skin - warm and dry, normal color, no suspicious lesions noted  Chest - effort normal, all lung fields clear to auscultation bilaterally  Heart - normal rate and regular rhythm  Breasts - breasts appear normal, no suspicious masses, no skin or nipple changes or  axillary  nodes  Abdomen - soft, nontender, nondistended, no masses or organomegaly  Pelvic -  VULVA: normal appearing vulva with no masses, tenderness or lesions   VAGINA: normal appearing vagina with normal color and discharge, no lesions   CERVIX: normal appearing cervix without discharge or lesions, no CMT  Thin prep pap is done with HR HPV cotesting  UTERUS: uterus is felt to be normal size, shape, consistency and nontender   ADNEXA: No adnexal masses or tenderness  noted.  Extremities:  No swelling or varicosities noted  Chaperone present for exam  No results found for this or any previous visit (from the past 24 hours).    Assessment & Plan:  Assessment and Plan Assessment & Plan Positive pregnancy test Positive test today. Differential includes residual hCG from recent abortion or new pregnancy due to recent unprotected intercourse. - Order quantitative serum hCG test today and repeat early next week to assess trend. - Educated on residual hCG versus new pregnancy. - Discussed importance of contraception to prevent unintended pregnancy.  Contraception management Desires to restart Depo-Provera. Previously used with side effects. Aware of need for backup contraception during first week after restarting. - Administer Depo-Provera injection after confirming hCG levels are decreasing. - Advise use of backup contraception for the first week after Depo-Provera administration.  General Health Maintenance - Cervical cancer screening: Discussed screening Q3 years. Reviewed importance of annual exams and limits of pap smear. Pap with reflex HPV collected - GC/CT: Discussed and recommended. Pt  declines - Birth Control:  depo once bHCG trend reviewed - Breast Health: Encouraged self breast awareness/exams.  - Follow-up: 12 months and prn   Orders Placed This Encounter  Procedures   Beta hCG quant (ref lab)   Ambulatory referral to Integrated Behavioral Health   POCT urine pregnancy    Meds: No orders of the defined types were placed in this encounter.   Follow-up: No follow-ups on file.  Lorriane Shire, MD 09/28/2023 8:28 AM

## 2023-09-29 LAB — BETA HCG QUANT (REF LAB): hCG Quant: 84 m[IU]/mL

## 2023-10-01 ENCOUNTER — Other Ambulatory Visit

## 2023-10-01 ENCOUNTER — Encounter: Payer: Self-pay | Admitting: Obstetrics and Gynecology

## 2023-10-01 ENCOUNTER — Other Ambulatory Visit: Payer: Self-pay | Admitting: Obstetrics and Gynecology

## 2023-10-01 DIAGNOSIS — N898 Other specified noninflammatory disorders of vagina: Secondary | ICD-10-CM

## 2023-10-01 DIAGNOSIS — Z3201 Encounter for pregnancy test, result positive: Secondary | ICD-10-CM

## 2023-10-01 DIAGNOSIS — B9689 Other specified bacterial agents as the cause of diseases classified elsewhere: Secondary | ICD-10-CM

## 2023-10-01 DIAGNOSIS — Z9889 Other specified postprocedural states: Secondary | ICD-10-CM

## 2023-10-01 LAB — CYTOLOGY - PAP: Diagnosis: NEGATIVE

## 2023-10-01 LAB — CERVICOVAGINAL ANCILLARY ONLY
Bacterial Vaginitis (gardnerella): POSITIVE — AB
Candida Glabrata: NEGATIVE
Candida Vaginitis: NEGATIVE
Comment: NEGATIVE
Comment: NEGATIVE
Comment: NEGATIVE

## 2023-10-01 MED ORDER — METRONIDAZOLE 0.75 % VA GEL: 1.0000 | Freq: Every day | VAGINAL | 0 refills | Status: DC

## 2023-10-01 MED ORDER — METRONIDAZOLE 500 MG PO TABS: 500.0000 mg | ORAL_TABLET | Freq: Two times a day (BID) | ORAL | 0 refills | Status: DC

## 2023-10-01 MED ORDER — FLUCONAZOLE 150 MG PO TABS: 150.0000 mg | ORAL_TABLET | Freq: Once | ORAL | 0 refills | Status: AC

## 2023-10-01 NOTE — Progress Notes (Signed)
 Patient presents for repeat HCG. Patient was sent to the lab to have labs drawn. Jasmine Dillon l Jasmine Dillon, CMA

## 2023-10-02 ENCOUNTER — Other Ambulatory Visit: Payer: Self-pay | Admitting: Obstetrics and Gynecology

## 2023-10-02 ENCOUNTER — Telehealth: Payer: Self-pay

## 2023-10-02 ENCOUNTER — Encounter: Payer: Self-pay | Admitting: Family Medicine

## 2023-10-02 ENCOUNTER — Encounter: Payer: Self-pay | Admitting: Obstetrics and Gynecology

## 2023-10-02 DIAGNOSIS — Z3201 Encounter for pregnancy test, result positive: Secondary | ICD-10-CM

## 2023-10-02 LAB — BETA HCG QUANT (REF LAB): hCG Quant: 53 m[IU]/mL

## 2023-10-02 NOTE — Telephone Encounter (Signed)
 Patient is aware that needs repeat quant next week.  She is also able to get her Depo on the same day per Dr. Briscoe Deutscher as her quants are trending down.  Jasmine Dillon [employer]

## 2023-10-02 NOTE — Telephone Encounter (Signed)
-----   Message from [facility] sent at 10/02/2023  9:03 AM EDT ----- Downtrend of bHCG, should be ok to proceed with depo. Follow to negative, repeat bHCG in 1 week.

## 2023-10-02 NOTE — Telephone Encounter (Signed)
-----   Message from Lorriane Shire sent at 10/01/2023  9:52 AM EDT ----- Will need repeat bhcg today or tomorrow to tred

## 2023-10-02 NOTE — Telephone Encounter (Signed)
 Patient is aware.  Jasmine Dillon [employer]

## 2023-10-08 ENCOUNTER — Ambulatory Visit

## 2023-10-08 VITALS — BP 119/63 | HR 72 | Ht 67.0 in | Wt 136.0 lb

## 2023-10-08 DIAGNOSIS — Z30013 Encounter for initial prescription of injectable contraceptive: Secondary | ICD-10-CM

## 2023-10-08 DIAGNOSIS — Z3202 Encounter for pregnancy test, result negative: Secondary | ICD-10-CM | POA: Diagnosis not present

## 2023-10-08 DIAGNOSIS — O021 Missed abortion: Secondary | ICD-10-CM

## 2023-10-08 LAB — POCT URINE PREGNANCY: Preg Test, Ur: NEGATIVE

## 2023-10-08 MED ORDER — MEDROXYPROGESTERONE ACETATE 150 MG/ML IM SUSP
150.0000 mg | Freq: Once | INTRAMUSCULAR | Status: AC
  Administered 2023-10-08: 150 mg via INTRAMUSCULAR

## 2023-10-08 NOTE — Progress Notes (Signed)
 Date last pap: 09/28/2023. Last Depo-Provera: 10/08/2023. Side Effects if any: none. Serum HCG indicated? . Depo-Provera 150 mg IM given by: [facility] RN. Urine pregnancy test negative. Next appointment due 12/24/2023-01/07/2024.

## 2023-10-09 ENCOUNTER — Encounter: Payer: Self-pay | Admitting: Obstetrics and Gynecology

## 2023-10-09 LAB — BETA HCG QUANT (REF LAB): hCG Quant: 22 m[IU]/mL

## 2023-10-11 ENCOUNTER — Telehealth: Payer: Self-pay | Admitting: Clinical

## 2023-10-11 NOTE — Telephone Encounter (Signed)
Attempt call regarding referral; Left HIPPA-compliant message to call back Mikle Sternberg from [facility] at  [phone] (Ashelyn Mccravy's office).    

## 2023-10-16 ENCOUNTER — Telehealth: Payer: Self-pay

## 2023-10-16 NOTE — Telephone Encounter (Signed)
 Spoke to patient.  Notified that she need to repeat Quants until levels reach 0.  Patient scheduled appt to come in on 4/28 for labs.  Jasmine Dillon [employer]

## 2023-10-16 NOTE — Telephone Encounter (Signed)
-----   Message from [place] sent at 10/09/2023  7:42 AM EDT ----- Follow bHCG to negative - repeat in about a week or so to be sure it completely resolves

## 2023-10-22 ENCOUNTER — Other Ambulatory Visit (INDEPENDENT_AMBULATORY_CARE_PROVIDER_SITE_OTHER)

## 2023-10-22 DIAGNOSIS — Z3A01 Less than 8 weeks gestation of pregnancy: Secondary | ICD-10-CM

## 2023-10-22 DIAGNOSIS — O021 Missed abortion: Secondary | ICD-10-CM

## 2023-10-22 NOTE — Progress Notes (Signed)
 Patient here to pick up Lab requisition for repeat quant.  Sent to lab.  Jasmine Dillon

## 2023-10-23 LAB — BETA HCG QUANT (REF LAB): hCG Quant: 7 m[IU]/mL

## 2023-10-24 ENCOUNTER — Encounter: Payer: Self-pay | Admitting: Obstetrics and Gynecology

## 2023-12-03 ENCOUNTER — Encounter: Payer: Self-pay | Admitting: Family Medicine

## 2023-12-03 ENCOUNTER — Ambulatory Visit: Admitting: Family Medicine

## 2023-12-03 VITALS — BP 110/64 | HR 87 | Temp 98.0°F | Resp 16 | Ht 67.0 in | Wt 138.0 lb

## 2023-12-03 DIAGNOSIS — G43109 Migraine with aura, not intractable, without status migrainosus: Secondary | ICD-10-CM | POA: Diagnosis not present

## 2023-12-03 DIAGNOSIS — S90222A Contusion of left lesser toe(s) with damage to nail, initial encounter: Secondary | ICD-10-CM | POA: Diagnosis not present

## 2023-12-03 MED ORDER — EPINEPHRINE 0.3 MG/0.3ML IJ SOAJ: 0.3000 mg | INTRAMUSCULAR | 1 refills | Status: AC | PRN

## 2023-12-03 MED ORDER — AMITRIPTYLINE HCL 10 MG PO TABS: 10.0000 mg | ORAL_TABLET | Freq: Every day | ORAL | 1 refills | Status: DC

## 2023-12-03 MED ORDER — NURTEC 75 MG PO TBDP: ORAL_TABLET | ORAL | 1 refills | Status: DC

## 2023-12-03 NOTE — Patient Instructions (Signed)
 If you do not hear anything about your referral in the next 1-2 weeks, call our office and ask for an update.  Let me know if there are cost issues with any of the medicine.   Let us  know if you need anything.

## 2023-12-03 NOTE — Progress Notes (Signed)
 Chief Complaint  Patient presents with   Toe Injury    Toe pain and Migraine   Migraine    Jasmine Dillon is a 28 y.o. female here for evaluation of left headache.  Treatment: None currently Happens 10 d per month.  Aura: Ringing in ears Palliation: sleep Provocation: poor hydration but no obvious trigger Associated symptoms: nausea, vomiting, sonophobia, photophobia, ataxia, slurred speech with severe headaches Denies: Gum chewing, jaw pain, injury, diplopia Currently with headache? Yes Failed therapies: Propranolol , Imitrex , Maxalt   BP 110/64 (BP Location: Left Arm, Patient Position: Sitting)   Pulse 87   Temp 98 F (36.7 C) (Oral)   Resp 16   Ht 5\' 7"  (1.702 m)   Wt 138 lb (62.6 kg)   SpO2 100%   BMI 21.61 kg/m  General: awake, alert, appearing stated age Eyes: PERRLA, EOMi Heart: RRR, no murmurs, no bruits Lungs: CTAB, no accessory muscle use Neuro: CN 2-12 intact, no cerebellar signs, DTR's equal and symmetry, no clonus MSK: 5/5 strength throughout, normal gait, no TTP over posterior cervical triangle or paraspinal cervical musculature Skin: ecchymosis under L great toe nail plate without ttp, erythema, excessive warmth Psych: Age appropriate judgment and insight, mood and affect normal  Migraine with aura and without status migrainosus, not intractable - Plan: Ambulatory referral to Neurology, Rimegepant Sulfate (NURTEC) 75 MG TBDP, amitriptyline (ELAVIL) 10 MG tablet  Subungual hematoma of toe of left foot, initial encounter  Chronic, not controlled.  Start amitriptyline 10 mg nightly.  She has failed propranolol  due to lack of efficacy.  She has failed Imitrex  due to side effects and Maxalt  due to lack of efficacy.  Start Nurtec 75 mg daily as needed.  Refer to neurology as a contingency plan.  Follow-up in 1 month for physical and to recheck this. Reassurance The patient voiced understanding and agreement to the plan.  Shellie Dials Ryder, DO 3:52  PM 12/03/23

## 2023-12-04 ENCOUNTER — Telehealth: Payer: Self-pay

## 2023-12-04 ENCOUNTER — Other Ambulatory Visit (HOSPITAL_COMMUNITY): Payer: Self-pay

## 2023-12-04 NOTE — Telephone Encounter (Signed)
 Pharmacy Patient Advocate Encounter   Received notification from Patient Pharmacy that prior authorization for Nurtec 75 is required/requested.   Insurance verification completed.   The patient is insured through [facility] .   Per test claim: PA required; PA submitted to above mentioned insurance via CoverMyMeds Key/confirmation #/EOC [facility] Status is pending

## 2023-12-04 NOTE — Telephone Encounter (Signed)
 Pharmacy Patient Advocate Encounter  Received notification from [facility] that Prior Authorization for Nurtec 75 has been DENIED.  No reason given; No denial letter received via Fax or CMM. It has been requested and will be uploaded to the media tab once received.   PA #/Case ID/Reference #: Oral Billings

## 2023-12-24 ENCOUNTER — Ambulatory Visit

## 2023-12-24 ENCOUNTER — Other Ambulatory Visit: Payer: Self-pay

## 2023-12-24 DIAGNOSIS — Z30013 Encounter for initial prescription of injectable contraceptive: Secondary | ICD-10-CM

## 2023-12-24 DIAGNOSIS — Z3042 Encounter for surveillance of injectable contraceptive: Secondary | ICD-10-CM

## 2023-12-24 MED ORDER — MEDROXYPROGESTERONE ACETATE 150 MG/ML IM SUSP: 150.0000 mg | INTRAMUSCULAR | 3 refills | Status: DC

## 2023-12-26 ENCOUNTER — Encounter: Payer: Self-pay | Admitting: Family Medicine

## 2023-12-27 ENCOUNTER — Encounter: Payer: Self-pay | Admitting: Family Medicine

## 2023-12-31 ENCOUNTER — Encounter: Payer: Self-pay | Admitting: Family Medicine

## 2023-12-31 ENCOUNTER — Ambulatory Visit

## 2023-12-31 ENCOUNTER — Telehealth: Payer: Self-pay

## 2023-12-31 ENCOUNTER — Ambulatory Visit: Admitting: Family Medicine

## 2023-12-31 VITALS — BP 112/68 | HR 100 | Temp 98.0°F | Resp 16 | Ht 67.0 in | Wt 141.0 lb

## 2023-12-31 VITALS — BP 120/77 | HR 90 | Ht 67.0 in | Wt 140.1 lb

## 2023-12-31 DIAGNOSIS — Z3042 Encounter for surveillance of injectable contraceptive: Secondary | ICD-10-CM | POA: Diagnosis not present

## 2023-12-31 DIAGNOSIS — F411 Generalized anxiety disorder: Secondary | ICD-10-CM

## 2023-12-31 DIAGNOSIS — G43109 Migraine with aura, not intractable, without status migrainosus: Secondary | ICD-10-CM | POA: Diagnosis not present

## 2023-12-31 MED ORDER — UBRELVY 100 MG PO TABS: ORAL_TABLET | ORAL | 2 refills | Status: AC

## 2023-12-31 MED ORDER — AMITRIPTYLINE HCL 25 MG PO TABS: 25.0000 mg | ORAL_TABLET | Freq: Every day | ORAL | 1 refills | Status: DC

## 2023-12-31 MED ORDER — PROPRANOLOL HCL 10 MG PO TABS: ORAL_TABLET | ORAL | 1 refills | Status: DC

## 2023-12-31 MED ORDER — MEDROXYPROGESTERONE ACETATE 150 MG/ML IM SUSP
150.0000 mg | Freq: Once | INTRAMUSCULAR | Status: AC
  Administered 2023-12-31: 150 mg via INTRAMUSCULAR

## 2023-12-31 NOTE — Telephone Encounter (Signed)
 Message sent to pt.

## 2023-12-31 NOTE — Patient Instructions (Signed)
 Aim to do some physical exertion for 150 minutes per week. This is typically divided into 5 days per week, 30 minutes per day. The activity should be enough to get your heart rate up. Anything is better than nothing if you have time constraints.  We will fax over the form today.  Let us  know if you need anything.

## 2023-12-31 NOTE — Progress Notes (Signed)
 Chief Complaint  Patient presents with   FMLA    FMLA Paperwork    Subjective Jasmine Dillon presents for f/u anxiety/depression.  Pt is currently being treated with Elavil  10 mg nightly. She will have intermittent peaks of anxiety and sometimes panic attacks.  This happens once or twice per week with anxiety spikes and about once per month with a true panic attack.  She does not have anything to take on an as-needed basis. She also has trouble with racing thoughts and difficulty sleeping. No thoughts of harming self or others. No self-medication with alcohol, prescription drugs or illicit drugs. Pt is going to start following with a counselor/psychologist.  She has had fewer migraines since starting the Elavil .  She has failed Imitrex  and Maxalt .  Nurtec was not covered by insurance.   Past Medical History:  Diagnosis Date   Asthma    Heart murmur    Migraine    Allergies as of 12/31/2023   No Known Allergies      Medication List        Accurate as of December 31, 2023  3:36 PM. If you have any questions, ask your nurse or doctor.          STOP taking these medications    Nurtec 75 MG Tbdp Generic drug: Rimegepant Sulfate Stopped by: Mabel Deward Pry   propranolol  ER 60 MG 24 hr capsule Commonly known as: Inderal  LA Replaced by: propranolol  10 MG tablet Stopped by: Mabel Deward Farrie Sann       TAKE these medications    albuterol  108 (90 Base) MCG/ACT inhaler Commonly known as: VENTOLIN  HFA Inhale 2 puffs into the lungs every 6 (six) hours as needed. For shortness of breath/wheezing   amitriptyline  25 MG tablet Commonly known as: ELAVIL  Take 1 tablet (25 mg total) by mouth at bedtime. What changed:  medication strength how much to take Changed by: Mabel Deward Pry   aspirin-acetaminophen-caffeine 250-250-65 MG tablet Commonly known as: EXCEDRIN MIGRAINE Take by mouth every 6 (six) hours as needed for headache.   EPINEPHrine  0.3 mg/0.3 mL  Soaj injection Commonly known as: EPI-PEN Inject 0.3 mg into the muscle as needed for anaphylaxis.   levocetirizine 5 MG tablet Commonly known as: XYZAL  TAKE 1 TABLET BY MOUTH EVERY DAY IN THE EVENING   medroxyPROGESTERone  150 MG/ML injection Commonly known as: DEPO-PROVERA  Inject 1 mL (150 mg total) into the muscle every 3 (three) months. What changed: Another medication with the same name was removed. Continue taking this medication, and follow the directions you see here. Changed by: Mabel Deward Pry   metroNIDAZOLE  0.75 % vaginal gel Commonly known as: METROGEL  Place 1 Applicatorful vaginally at bedtime. Apply one applicatorful to vagina at bedtime for 5 days   montelukast  10 MG tablet Commonly known as: SINGULAIR  TAKE 1 TABLET BY MOUTH EVERYDAY AT BEDTIME   multivitamin with minerals Tabs tablet Take 1 tablet by mouth daily.   propranolol  10 MG tablet Commonly known as: INDERAL  Take 1-2 tabs by mouth 3 times daily as needed for anxiety/panic attacks. Replaces: propranolol  ER 60 MG 24 hr capsule Started by: Mabel Deward Pry   Ubrelvy  100 MG Tabs Generic drug: Ubrogepant  Take 1 tab as needed for migraines. May repeat in 2 hours if no improvement. No more than 200 mg in a 24 hour period. Started by: Mabel Deward Pry        Exam BP 112/68 (BP Location: Left Arm, Patient Position: Sitting)   Pulse 100  Temp 98 F (36.7 C) (Oral)   Resp 16   Ht 5' 7 (1.702 m)   Wt 141 lb (64 kg)   LMP 06/28/2023 (Exact Date)   SpO2 100%   BMI 22.08 kg/m  General:  well developed, well nourished, in no apparent distress Lungs:  No respiratory distress Psych: well oriented with normal range of affect and age-appropriate judgement/insight, alert and oriented x4.  Assessment and Plan  Migraine with aura and without status migrainosus, not intractable - Plan: Ubrogepant  (UBRELVY ) 100 MG TABS, amitriptyline  (ELAVIL ) 25 MG tablet  GAD (generalized anxiety  disorder) - Plan: amitriptyline  (ELAVIL ) 25 MG tablet, propranolol  (INDERAL ) 10 MG tablet  Chronic, improving.  Increase Elavil  from 10 mg daily to 25 mg daily.  Add Ubrelvy  100 mg daily as needed. Chronic, not controlled.  Increase Elavil  as above.  Appreciate the fact that she is going to see a Veterinary surgeon.  Counseled on exercise.  Start propranolol  10-20 mg 3 times daily as needed for anxiety.  Follow-up in 1 month to recheck this. FMLA form filled out allowing intermittent leave, for now 1 episode per week, 1 day per episode.  As panic attacks decrease and migraines improve, I do not think she will need this frequently. The patient voiced understanding and agreement to the plan.  Mabel [place], DO 12/31/23 3:36 PM

## 2024-01-08 NOTE — BH Specialist Note (Unsigned)
 Integrated Behavioral Health via Telemedicine Visit  01/09/2024 Jasmine Dillon [id number]  Number of Integrated Behavioral Health Clinician visits: 1- Initial Visit  Session Start time: [phone]   Session End time: 1017  Total time in minutes: 59    Referring Provider: Carter Quarry, MD Patient/Family location: Home [facility] Provider location: [facility]  All persons participating in visit: Patient Jasmine Dillon and [facility] Trevontae Lindahl   Types of Service: Individual psychotherapy and Video visit  I connected with Candelaria LITTIE Lot and/or Candelaria LITTIE Dail's n/a via  Telephone or Video Enabled Telemedicine Application  (Video is Caregility application) and verified that I am speaking with the correct person using two identifiers. Discussed confidentiality: Yes   I discussed the limitations of telemedicine and the availability of in person appointments.  Discussed there is a possibility of technology failure and discussed alternative modes of communication if that failure occurs.  I discussed that engaging in this telemedicine visit, they consent to the provision of behavioral healthcare and the services will be billed under their insurance.  Patient and/or legal guardian expressed understanding and consented to Telemedicine visit: Yes   Presenting Concerns: Patient and/or family reports the following symptoms/concerns: Increasing anxiety with panic attacks, difficulty falling asleep; worry and life stress contributing to migraine symptoms (numbness, tingling, chest tightness, dizziness, often preceding migraine). Pt has some self-coping strategies that have been somewhat helpful; open to implementing additional strategies; will begin taking increased dosage of amitriptyline  to help manage migraine. Pt's caffeine intake varies from day to day; uncertainty regarding any impact on ability to fall asleep or migraine sensitivity.  Duration of  problem: Ongoing with increase after a pregnancy loss; Severity of problem: moderately severe  Patient and/or Family's Strengths/Protective Factors: Social connections, Concrete supports in place (healthy food, safe environments, etc.), and Sense of purpose  Goals Addressed: Patient will:  Reduce symptoms of: anxiety, insomnia, and stress   Increase knowledge and/or ability of: healthy habits and self-management skills   Demonstrate ability to: Increase healthy adjustment to current life circumstances and Increase motivation to adhere to plan of care  Progress towards Goals: Ongoing    Interventions: Interventions utilized:  Solution-Focused Strategies, Mindfulness or Management consultant, and Psychoeducation and/or Health Education Standardized Assessments completed: Not Needed    Patient and/or Family Response: Patient agrees with treatment plan.   Clinical Assessment/Diagnosis  Generalized anxiety disorder with panic attacks .   Patient may benefit from psychoeducation and brief therapeutic interventions regarding coping with symptoms of anxiety, depression, life stress .  Plan: Follow up with behavioral health clinician on : Two weeks Behavioral recommendations:  -Begin taking migraine medication(amitriptyline )  increase, and other medications as prescribed -CALM relaxation breathing exercise twice daily (morning; at bedtime with sleep sounds); as needed throughout the day. -Begin Worry Time strategy, as discussed. Start by setting up start and end time reminders on phone today; continue daily for two weeks. -Consider keeping log of caffeine intake and sleep (to see if higher caffeine later in the day has any impact on ability to fall asleep or not) -Continue using current self-coping strategies that remain helpful (deep breathing; positive self-talk; limiting news/social media consumption) Referral(s): Integrated [employer] (In Clinic)  I discussed the  assessment and treatment plan with the patient and/or parent/guardian. They were provided an opportunity to ask questions and all were answered. They agreed with the plan and demonstrated an understanding of the instructions.   They were advised to call  back or seek an in-person evaluation if the symptoms worsen or if the condition fails to improve as anticipated.  Warren BROCKS Furkan Keenum, LCSW     12/03/2023    3:30 PM 12/03/2023    3:28 PM 01/31/2022    3:06 PM 07/05/2021   12:52 PM 01/18/2021   12:51 PM  Depression screen PHQ 2/9  Decreased Interest 0  0 0 1  Down, Depressed, Hopeless 0 0 0 0 1  PHQ - 2 Score 0 0 0 0 2  Altered sleeping 0 0 1 0 3  Tired, decreased energy 0 0 2 0 1  Change in appetite 0 0 1 0 1  Feeling bad or failure about yourself  0 0 0 0 2  Trouble concentrating 0 0 0 0 2  Moving slowly or fidgety/restless 0 0 0 0 1  Suicidal thoughts 0 0 0 0 0  PHQ-9 Score 0 0 4 0 12  Difficult doing work/chores Not difficult at all Not difficult at all Not difficult at all Not difficult at all Somewhat difficult      12/03/2023    3:29 PM  GAD 7 : Generalized Anxiety Score  Nervous, Anxious, on Edge 1  Control/stop worrying 2  Worry too much - different things 2  Trouble relaxing 1  Restless 2  Easily annoyed or irritable 1  Afraid - awful might happen 0  Total GAD 7 Score 9  Anxiety Difficulty Not difficult at all

## 2024-01-09 ENCOUNTER — Ambulatory Visit (INDEPENDENT_AMBULATORY_CARE_PROVIDER_SITE_OTHER): Payer: Self-pay | Admitting: Clinical

## 2024-01-09 DIAGNOSIS — F411 Generalized anxiety disorder: Secondary | ICD-10-CM | POA: Diagnosis not present

## 2024-01-09 DIAGNOSIS — F410 Panic disorder [episodic paroxysmal anxiety] without agoraphobia: Secondary | ICD-10-CM

## 2024-01-09 NOTE — Patient Instructions (Signed)
 [facility] [address] [place], [place] [address] [phone] (main office) [phone] (Konner Saiz's office)

## 2024-01-15 ENCOUNTER — Telehealth: Payer: Self-pay

## 2024-01-15 NOTE — Telephone Encounter (Signed)
 Message pt letting her know I faxed and email  FMLA paperwork. Mailed a copy to Pt.

## 2024-01-23 ENCOUNTER — Ambulatory Visit (INDEPENDENT_AMBULATORY_CARE_PROVIDER_SITE_OTHER): Payer: Self-pay

## 2024-01-23 DIAGNOSIS — F411 Generalized anxiety disorder: Secondary | ICD-10-CM | POA: Diagnosis not present

## 2024-01-23 DIAGNOSIS — F410 Panic disorder [episodic paroxysmal anxiety] without agoraphobia: Secondary | ICD-10-CM

## 2024-01-23 NOTE — BH Specialist Note (Signed)
 Integrated Behavioral Health via Telemedicine Visit  01/23/2024 Jasmine Dillon [id number]  Number of Integrated Behavioral Health Clinician visits: 2- Second Visit  Session Start time: [phone]   Session End time: 0950  Total time in minutes: 34    Referring Provider: Carter Quarry, MD Patient/Family location: Home [facility] Provider location: [facility] for [facility]  All persons participating in visit: Patient Jasmine Dillon and [facility] Jasmine Dillon   Types of Service: Individual psychotherapy and Video visit  I connected with Jasmine Dillon and/or Jasmine Dillon's n/a via  Telephone or Video Enabled Telemedicine Application  (Video is Caregility application) and verified that I am speaking with the correct person using two identifiers. Discussed confidentiality: Yes   I discussed the limitations of telemedicine and the availability of in person appointments.  Discussed there is a possibility of technology failure and discussed alternative modes of communication if that failure occurs.  I discussed that engaging in this telemedicine visit, they consent to the provision of behavioral healthcare and the services will be billed under their insurance.  Patient and/or legal guardian expressed understanding and consented to Telemedicine visit: Yes   Presenting Concerns: Patient and/or family reports the following symptoms/concerns: Anxiety becoming more manageable; focusing on goals (obtain driver's license, buy first home) while maintaining work/life balance.  Duration of problem: Ongoing; Severity of problem: moderate  Patient and/or Family's Strengths/Protective Factors: Social connections, Concrete supports in place (healthy food, safe environments, etc.), Sense of purpose, and Physical Health (exercise, healthy diet, medication compliance, etc.)  Goals Addressed: Patient will:  Reduce symptoms of: anxiety   Increase knowledge and/or  ability of: self-management skills   Demonstrate ability to: Increase motivation to adhere to plan of care  Progress towards Goals: Ongoing    Interventions: Interventions utilized:  Solution-Focused Strategies Standardized Assessments completed: GAD-7    Patient and/or Family Response: Patient agrees with treatment plan.   Clinical Assessment/Diagnosis  Generalized anxiety disorder with panic attacks    Patient may benefit from continued therapeutic intervention  .  Plan: Follow up with behavioral health clinician on : Call Jasmine Dillon at [phone], as needed. Behavioral recommendations:  -Continue taking migraine medication as prescribed -Continue daily self-coping strategies for as long as remain helpful (relaxation breathing, worry time list, deep breathing, positive self-talk, limiting news/social media, working on creative pursuits) -Continue working towards life goals (obtain driver's license prior to next birthday; obtain car; purchase first home). First step: increase income with additional work -Consider using score.org as additional resource to help with business Referral(s): Integrated Art gallery manager (In Clinic) and Community Resources:  SCORE  I discussed the assessment and treatment plan with the patient and/or parent/guardian. They were provided an opportunity to ask questions and all were answered. They agreed with the plan and demonstrated an understanding of the instructions.   They were advised to call back or seek an in-person evaluation if the symptoms worsen or if the condition fails to improve as anticipated.  Jasmine Dillon Jasmine Akhter, LCSW     12/03/2023    3:30 PM 12/03/2023    3:28 PM 01/31/2022    3:06 PM 07/05/2021   12:52 PM 01/18/2021   12:51 PM  Depression screen PHQ 2/9  Decreased Interest 0  0 0 1  Down, Depressed, Hopeless 0 0 0 0 1  PHQ - 2 Score 0 0 0 0 2  Altered sleeping 0 0 1 0 3  Tired, decreased energy 0 0 2 0 1  Change in appetite  0 0 1 0 1  Feeling bad or failure about yourself  0 0 0 0 2  Trouble concentrating 0 0 0 0 2  Moving slowly or fidgety/restless 0 0 0 0 1  Suicidal thoughts 0 0 0 0 0  PHQ-9 Score 0 0 4 0 12  Difficult doing work/chores Not difficult at all Not difficult at all Not difficult at all Not difficult at all Somewhat difficult      01/23/2024    9:42 AM 12/03/2023    3:29 PM  GAD 7 : Generalized Anxiety Score  Nervous, Anxious, on Edge 1 1  Control/stop worrying 1 2  Worry too much - different things 0 2  Trouble relaxing 0 1  Restless 1 2  Easily annoyed or irritable 0 1  Afraid - awful might happen 1 0  Total GAD 7 Score 4 9  Anxiety Difficulty  Not difficult at all

## 2024-01-23 NOTE — Patient Instructions (Signed)
[facility] [address] [place], [place] [address] [phone] (main office) [phone] (Thuy Atilano's office)   SCORE: For the life of your business www.score.org   [facility] [email or web address]   

## 2024-02-01 ENCOUNTER — Ambulatory Visit: Admitting: Family Medicine

## 2024-03-08 ENCOUNTER — Other Ambulatory Visit: Payer: Self-pay | Admitting: Family Medicine

## 2024-03-08 DIAGNOSIS — G43109 Migraine with aura, not intractable, without status migrainosus: Secondary | ICD-10-CM

## 2024-03-08 DIAGNOSIS — F411 Generalized anxiety disorder: Secondary | ICD-10-CM

## 2024-03-11 ENCOUNTER — Encounter: Payer: Self-pay | Admitting: Obstetrics and Gynecology

## 2024-03-17 ENCOUNTER — Ambulatory Visit

## 2024-03-28 ENCOUNTER — Ambulatory Visit

## 2024-03-28 VITALS — BP 109/75 | HR 86 | Wt 158.0 lb

## 2024-03-28 DIAGNOSIS — Z3042 Encounter for surveillance of injectable contraceptive: Secondary | ICD-10-CM | POA: Diagnosis not present

## 2024-03-28 MED ORDER — MEDROXYPROGESTERONE ACETATE 150 MG/ML IM SUSP
150.0000 mg | Freq: Once | INTRAMUSCULAR | Status: AC
  Administered 2024-03-28: 150 mg via INTRAMUSCULAR

## 2024-03-28 NOTE — Progress Notes (Addendum)
 Date last pap: 09/28/23. Last Depo-Provera : 12/24/23. Side Effects if any:N/A Serum HCG indicated? N/A. Depo-Provera  150 mg IM given by: York Blush. Next appointment due 06/13/24.   Arun Herrod l Barnaby Rippeon, CMA

## 2024-03-30 ENCOUNTER — Other Ambulatory Visit: Payer: Self-pay | Admitting: Family Medicine

## 2024-03-30 DIAGNOSIS — F411 Generalized anxiety disorder: Secondary | ICD-10-CM

## 2024-03-31 ENCOUNTER — Other Ambulatory Visit: Payer: Self-pay | Admitting: Family Medicine

## 2024-03-31 DIAGNOSIS — G43109 Migraine with aura, not intractable, without status migrainosus: Secondary | ICD-10-CM

## 2024-03-31 DIAGNOSIS — F411 Generalized anxiety disorder: Secondary | ICD-10-CM

## 2024-04-20 ENCOUNTER — Other Ambulatory Visit: Payer: Self-pay | Admitting: Family Medicine

## 2024-04-20 DIAGNOSIS — J302 Other seasonal allergic rhinitis: Secondary | ICD-10-CM

## 2024-04-21 ENCOUNTER — Encounter: Payer: Self-pay | Admitting: Family Medicine

## 2024-04-22 ENCOUNTER — Telehealth (INDEPENDENT_AMBULATORY_CARE_PROVIDER_SITE_OTHER): Admitting: Family Medicine

## 2024-04-22 DIAGNOSIS — G43109 Migraine with aura, not intractable, without status migrainosus: Secondary | ICD-10-CM

## 2024-04-22 DIAGNOSIS — F411 Generalized anxiety disorder: Secondary | ICD-10-CM | POA: Diagnosis not present

## 2024-04-22 MED ORDER — AMITRIPTYLINE HCL 50 MG PO TABS: 50.0000 mg | ORAL_TABLET | Freq: Every day | ORAL | 1 refills | Status: DC

## 2024-04-22 NOTE — Progress Notes (Signed)
 CC: F/u  Subjective Jasmine Dillon presents for f/u anxiety. We are interacting via web portal for an electronic face-to-face visit. I verified patient's ID using 2 identifiers. Patient agreed to proceed with visit via this method. Patient is at home, I am at office. Patient and I are present for visit.   Pt is currently being treated with Elavil  25 mg/d.   Reports struggling over past few weeks since treatment. Work is stressful and she has not been sleeping well.  No thoughts of harming self or others. No self-medication with alcohol, prescription drugs or illicit drugs. Pt is not following with a counselor/psychologist. Migraines becoming more freq. She never got the Ubrelvy  2/2 cost. She has failed Imitrex  and Maxalt .   Past Medical History:  Diagnosis Date   Asthma    Heart murmur    Migraine    Allergies as of 04/22/2024   No Known Allergies      Medication List        Accurate as of April 22, 2024  3:47 PM. If you have any questions, ask your nurse or doctor.          STOP taking these medications    albuterol  108 (90 Base) MCG/ACT inhaler Commonly known as: VENTOLIN  HFA Stopped by: Mabel Deward Pry   medroxyPROGESTERone  150 MG/ML injection Commonly known as: DEPO-PROVERA  Stopped by: Mabel Deward Pry   metroNIDAZOLE  0.75 % vaginal gel Commonly known as: METROGEL  Stopped by: Mabel Deward Kyndal Gloster       TAKE these medications    amitriptyline  50 MG tablet Commonly known as: ELAVIL  Take 1 tablet (50 mg total) by mouth at bedtime. What changed:  medication strength See the new instructions. Changed by: Mabel Deward Pry   aspirin-acetaminophen-caffeine 250-250-65 MG tablet Commonly known as: EXCEDRIN MIGRAINE Take by mouth every 6 (six) hours as needed for headache.   EPINEPHrine  0.3 mg/0.3 mL Soaj injection Commonly known as: EPI-PEN Inject 0.3 mg into the muscle as needed for anaphylaxis.   levocetirizine 5 MG  tablet Commonly known as: XYZAL  TAKE 1 TABLET BY MOUTH EVERY DAY IN THE EVENING   montelukast  10 MG tablet Commonly known as: SINGULAIR  TAKE 1 TABLET BY MOUTH EVERYDAY AT BEDTIME   multivitamin with minerals Tabs tablet Take 1 tablet by mouth daily.   propranolol  10 MG tablet Commonly known as: INDERAL  TAKE 1-2 TABS BY MOUTH 3 TIMES DAILY AS NEEDED FOR ANXIETY/PANIC ATTACKS.   Ubrelvy  100 MG Tabs Generic drug: Ubrogepant  Take 1 tab as needed for migraines. May repeat in 2 hours if no improvement. No more than 200 mg in a 24 hour period.        Exam No conversational dyspnea Age appropriate judgment and insight Nml affect and mood  Assessment and Plan  GAD (generalized anxiety disorder) - Plan: amitriptyline  (ELAVIL ) 50 MG tablet, AMB Referral [facility]  Migraine with aura and without status migrainosus, not intractable - Plan: amitriptyline  (ELAVIL ) 50 MG tablet, AMB Referral [facility]  Chronic, uncontrolled. Increase Elavil  50 mg/d from 25 mg/d. Cont propranolol  prn. Counseled on exercise. 8 d per mo for FMLA.  As above w Elavil  increase. Cont Ubrelvy  as needed.  Could consider samples if ins won't cover. Has failed 2 triptans Imitrex  and Maxalt .  F/u in 1 mo. The patient voiced understanding and agreement to the plan.  Mabel Deward Luyando, DO 04/22/24 3:47 PM

## 2024-04-30 ENCOUNTER — Telehealth: Payer: Self-pay | Admitting: *Deleted

## 2024-04-30 ENCOUNTER — Encounter: Payer: Self-pay | Admitting: Family Medicine

## 2024-04-30 ENCOUNTER — Ambulatory Visit: Payer: Self-pay

## 2024-04-30 NOTE — Telephone Encounter (Signed)
 FYI Only or Action Required?: FYI only for provider: ED advised.  Patient was last seen in primary care on 04/22/2024 by Frann Mabel Mt, DO.  Called Nurse Triage reporting Neurologic Problem.  Symptoms began today.  Interventions attempted: Other: relaxation techniques.  Symptoms are: gradually worsening.  Triage Disposition: Call EMS 911 Now  Patient/caregiver understands and will follow disposition?: Yes   Copied from CRM #[id number]. Topic: Clinical - Red Word Triage >> Apr 30, 2024  5:05 PM Ashley R wrote: Red Word that prompted transfer to Nurse Triage: numbness in both arms, now just right arm (like it's falling asleep), dizzyness, light headness, resting heart rate at 114 down to 92 Reason for Disposition  [1] Numbness (i.e., loss of sensation) of the face, arm / hand, or leg / foot on one side of the body AND [2] sudden onset AND [3] present now  Answer Assessment - Initial Assessment Questions Additional info: ED advised, patient in agreement.     1. SYMPTOM: What is the main symptom you are concerned about? (e.g., weakness, numbness)     Numbness, tingling, dizziness 2. ONSET: When did this start? (e.g., minutes, hours, days; while sleeping)     Today  3. LAST NORMAL: When was the last time you (the patient) were normal (no symptoms)?     1.5 hours ago suddenly  4. PATTERN Does this come and go, or has it been constant since it started?  Is it present now?     Constant  5. CARDIAC SYMPTOMS: Have you had any of the following symptoms: chest pain, difficulty breathing, palpitations?     Heart rate 114 6. NEUROLOGIC SYMPTOMS: Have you had any of the following symptoms: headache, dizziness, vision loss, double vision, changes in speech, unsteady on your feet?     Started with bilateral arm tingling, left has resolved and now right arm is numb it feels asleep. 7. OTHER SYMPTOMS: Do you have any other symptoms?     She does mention she has high  anxiety and will feel similar symptoms of tingling, never numbness, but they resolve quickly and without intervention, this episode is different.  Protocols used: Neurologic Deficit-A-AH

## 2024-04-30 NOTE — Progress Notes (Signed)
.  Complex Care Management Note Care Guide Note  04/30/2024 Name: Jasmine Dillon MRN: [id number] DOB: Sep 26, 1995   Complex Care Management Outreach Attempts: An unsuccessful telephone outreach was attempted today to offer the patient information about available complex care management services.  Follow Up Plan:  Additional outreach attempts will be made to offer the patient complex care management information and services.   Encounter Outcome:  No Answer  [facility] HealthPopulation Health Care Guide  Direct Dial:[phone] Fax:[phone] Website: [email or web address]

## 2024-05-01 ENCOUNTER — Telehealth: Payer: Self-pay | Admitting: *Deleted

## 2024-05-01 ENCOUNTER — Telehealth: Payer: Self-pay

## 2024-05-01 NOTE — Progress Notes (Signed)
 areComplex Care Management Note Care Guide Note  05/01/2024 Name: KYLANI WIRES MRN: [id number] DOB: 1996/01/10   Complex Care Management Outreach Attempts: A second unsuccessful outreach was attempted today to offer the patient with information about available complex care management services.  Follow Up Plan:  Additional outreach attempts will be made to offer the patient complex care management information and services.   Encounter Outcome:  No Answer  [facility] HealthPopulation Health Care Guide  Direct Dial:[phone] [phone] Website: [email or web address]

## 2024-05-01 NOTE — Telephone Encounter (Signed)
 Copied from CRM [id number]. Topic: General - Other >> May 01, 2024 11:24 AM Alexandria E wrote: Reason for CRM: Porter MARGRETT bean with patient's employer, called in needing clarification on patient's FMLA paperwork. Porter stated that Part B states that patient will need off 8 hours a week, whereas Part C states that she will need off 8 days a month, Ivy just needs clarification on which one she should go by. Porter also stated that migraines was spelled migrants on the [facility] forms. Callback number for Porter is [phone].

## 2024-05-02 ENCOUNTER — Telehealth: Payer: Self-pay | Admitting: *Deleted

## 2024-05-02 NOTE — Telephone Encounter (Signed)
 Left detailed message to check patient status and to call us  back to let us  know if she went anywhere and how she doing.

## 2024-05-02 NOTE — Progress Notes (Signed)
 Complex Care Management Note Care Guide Note  05/02/2024 Name: TERESE HEIER MRN: [id number] DOB: 1996/04/28   Complex Care Management Outreach Attempts: An unsuccessful telephone outreach was attempted today to offer the patient information about available complex care management services.  Follow Up Plan: No  Additional outreach attempts will be made to offer the patient complex care management information and services. Three attempts  Encounter Outcome:  No Answer  [facility] HealthPopulation Health Care Guide  Direct Dial:[phone] Fax:[phone] Website: [email or web address]

## 2024-05-02 NOTE — Telephone Encounter (Signed)
 Called spoke with HR and changes was made and faxed paper.

## 2024-05-05 ENCOUNTER — Encounter: Payer: Self-pay | Admitting: Family Medicine

## 2024-05-05 ENCOUNTER — Ambulatory Visit: Admitting: Family Medicine

## 2024-05-05 VITALS — BP 124/76 | HR 68 | Temp 98.0°F | Resp 16 | Ht 67.0 in | Wt 161.0 lb

## 2024-05-05 DIAGNOSIS — F411 Generalized anxiety disorder: Secondary | ICD-10-CM | POA: Diagnosis not present

## 2024-05-05 DIAGNOSIS — J302 Other seasonal allergic rhinitis: Secondary | ICD-10-CM

## 2024-05-05 MED ORDER — MONTELUKAST SODIUM 10 MG PO TABS: 10.0000 mg | ORAL_TABLET | Freq: Every day | ORAL | 3 refills | Status: AC

## 2024-05-05 NOTE — Progress Notes (Signed)
 Chief Complaint  Patient presents with   Dizziness    Dizziness     Subjective: Patient is a 28 y.o. female here for f/u.  Patient has been dealing with increased anxiety over the past several weeks.  She had an episode on Saturday where she had difficulty breathing, tingling in her arm, and passed out.  This happened 1 year ago when she had a bad panic attack.  She did not have her propranolol  nearby.  She has not increased the dosage of her amitriptyline  just yet.  She is not seeing a therapist.  No homicidal or suicidal ideation.  She did not bite her tongue, lose control of her bowel/bladder function, or have a postictal state.  Past Medical History:  Diagnosis Date   Asthma    Heart murmur    Migraine     Objective: BP 124/76 (BP Location: Left Arm, Patient Position: Sitting)   Pulse 68   Temp 98 F (36.7 C) (Oral)   Resp 16   Ht 5' 7 (1.702 m)   Wt 161 lb (73 kg)   SpO2 98%   BMI 25.22 kg/m  General: Awake, appears stated age Heart: RRR, no LE edema Lungs: CTAB, no rales, wheezes or rhonchi. No accessory muscle use Neuro: Neg Spurling's b/l, 5/5 strength throughout, DTR's equal and symmetric throughout, no clonus, no cerebellar signs, gait is normal MSK: No TTP Psych: Age appropriate judgment and insight, normal affect and mood  Assessment and Plan: GAD (generalized anxiety disorder)  Seasonal allergies - Plan: montelukast  (SINGULAIR ) 10 MG tablet  Suspect this is related to anxiety.  We did talk through her symptoms and how they are likely related to this.  She agrees with this and will let us  know if anything changes.  Continue propranolol  as needed, continue with doses escalation of amitriptyline  from 25 mg daily to 50 mg daily.  Follow-up as originally scheduled. The patient voiced understanding and agreement to the plan.  Jasmine Mt Simpson, DO 05/05/24  1:25 PM

## 2024-05-05 NOTE — Patient Instructions (Signed)
 Consider using GoodRx for your montelukast .  Let us  know if you need anything.

## 2024-05-26 ENCOUNTER — Encounter: Payer: Self-pay | Admitting: Family Medicine

## 2024-06-13 ENCOUNTER — Ambulatory Visit

## 2024-06-15 ENCOUNTER — Other Ambulatory Visit: Payer: Self-pay | Admitting: Family Medicine

## 2024-06-15 DIAGNOSIS — F411 Generalized anxiety disorder: Secondary | ICD-10-CM

## 2024-06-16 ENCOUNTER — Ambulatory Visit (INDEPENDENT_AMBULATORY_CARE_PROVIDER_SITE_OTHER)

## 2024-06-16 VITALS — BP 129/73 | HR 103 | Ht 67.0 in | Wt 172.0 lb

## 2024-06-16 DIAGNOSIS — Z3042 Encounter for surveillance of injectable contraceptive: Secondary | ICD-10-CM | POA: Diagnosis not present

## 2024-06-16 MED ORDER — MEDROXYPROGESTERONE ACETATE 150 MG/ML IM SUSY
150.0000 mg | PREFILLED_SYRINGE | Freq: Once | INTRAMUSCULAR | Status: AC
  Administered 2024-06-16: 150 mg via INTRAMUSCULAR

## 2024-06-16 NOTE — Progress Notes (Signed)
 Date last pap: 09/28/2023. Last Depo-Provera : 03/28/2024. Side Effects if any: None. Serum HCG indicated? No. Depo-Provera  150 mg IM given by: [facility] RN. Next appointment due 09/01/24-09/15/2024.

## 2024-06-28 ENCOUNTER — Other Ambulatory Visit: Payer: Self-pay | Admitting: Family Medicine

## 2024-06-28 DIAGNOSIS — G43109 Migraine with aura, not intractable, without status migrainosus: Secondary | ICD-10-CM

## 2024-06-28 DIAGNOSIS — F411 Generalized anxiety disorder: Secondary | ICD-10-CM

## 2024-07-01 ENCOUNTER — Other Ambulatory Visit: Payer: Self-pay

## 2024-07-01 ENCOUNTER — Other Ambulatory Visit: Payer: Self-pay | Admitting: Family Medicine

## 2024-07-01 ENCOUNTER — Encounter: Payer: Self-pay | Admitting: Family Medicine

## 2024-07-01 DIAGNOSIS — F411 Generalized anxiety disorder: Secondary | ICD-10-CM

## 2024-07-01 DIAGNOSIS — G43109 Migraine with aura, not intractable, without status migrainosus: Secondary | ICD-10-CM

## 2024-07-01 MED ORDER — AMITRIPTYLINE HCL 50 MG PO TABS: 50.0000 mg | ORAL_TABLET | Freq: Every day | ORAL | 1 refills | Status: DC

## 2024-07-01 MED ORDER — AMITRIPTYLINE HCL 50 MG PO TABS: 50.0000 mg | ORAL_TABLET | Freq: Every day | ORAL | 1 refills | Status: AC

## 2024-07-01 NOTE — Telephone Encounter (Signed)
 Copied from CRM #[id number]. Topic: Clinical - Medication Refill >> Jul 01, 2024  1:24 PM Aleatha C wrote: Medication:  amitriptyline  (ELAVIL ) 50 MG tablet   Has the patient contacted their pharmacy? Yes (Agent: If no, request that the patient contact the pharmacy for the refill. If patient does not wish to contact the pharmacy document the reason why and proceed with request.) (Agent: If yes, when and what did the pharmacy advise?)  This is the patient's preferred pharmacy:    [facility] [address] [place] - [address] Jasmine Dillon [address] Phone: [phone] Fax: [phone]  Is this the correct pharmacy for this prescription? Yes If no, delete pharmacy and type the correct one.   Has the prescription been filled recently? No  Is the patient out of the medication? Yes  Has the patient been seen for an appointment in the last year OR does the patient have an upcoming appointment? Yes  Can we respond through MyChart? No  Agent: Please be advised that Rx refills may take up to 3 business days. We ask that you follow-up with your pharmacy.

## 2024-09-08 ENCOUNTER — Ambulatory Visit
